# Patient Record
Sex: Female | Born: 1965
Health system: Southern US, Community
[De-identification: ages and names within clinical notes are randomized; demographics above are authoritative.]

## PROBLEM LIST (undated history)

## (undated) DIAGNOSIS — Z789 Other specified health status: Secondary | ICD-10-CM

---

## 2003-12-21 ENCOUNTER — Ambulatory Visit (HOSPITAL_COMMUNITY): Admission: RE | Admit: 2003-12-21 | Discharge: 2003-12-21 | Payer: Self-pay | Admitting: Family Medicine

## 2004-10-27 ENCOUNTER — Other Ambulatory Visit: Admission: RE | Admit: 2004-10-27 | Discharge: 2004-10-27 | Payer: Self-pay | Admitting: Dermatology

## 2006-01-10 ENCOUNTER — Emergency Department (HOSPITAL_COMMUNITY): Admission: EM | Admit: 2006-01-10 | Discharge: 2006-01-10 | Payer: Self-pay | Admitting: Emergency Medicine

## 2007-07-18 ENCOUNTER — Ambulatory Visit (HOSPITAL_COMMUNITY): Admission: RE | Admit: 2007-07-18 | Discharge: 2007-07-18 | Payer: Self-pay | Admitting: Family Medicine

## 2007-11-11 ENCOUNTER — Emergency Department (HOSPITAL_COMMUNITY): Admission: EM | Admit: 2007-11-11 | Discharge: 2007-11-12 | Payer: Self-pay | Admitting: Emergency Medicine

## 2007-11-18 ENCOUNTER — Ambulatory Visit (HOSPITAL_COMMUNITY): Admission: RE | Admit: 2007-11-18 | Discharge: 2007-11-18 | Payer: Self-pay | Admitting: Family Medicine

## 2007-12-09 ENCOUNTER — Ambulatory Visit (HOSPITAL_COMMUNITY): Admission: RE | Admit: 2007-12-09 | Discharge: 2007-12-09 | Payer: Self-pay | Admitting: Family Medicine

## 2010-09-04 ENCOUNTER — Encounter: Payer: Self-pay | Admitting: Family Medicine

## 2015-09-20 ENCOUNTER — Encounter: Payer: Self-pay | Admitting: Obstetrics and Gynecology

## 2015-12-08 ENCOUNTER — Other Ambulatory Visit (HOSPITAL_COMMUNITY): Payer: Self-pay | Admitting: Family Medicine

## 2015-12-08 DIAGNOSIS — R221 Localized swelling, mass and lump, neck: Principal | ICD-10-CM

## 2015-12-08 DIAGNOSIS — R220 Localized swelling, mass and lump, head: Secondary | ICD-10-CM

## 2015-12-13 ENCOUNTER — Ambulatory Visit (HOSPITAL_COMMUNITY)
Admission: RE | Admit: 2015-12-13 | Discharge: 2015-12-13 | Disposition: A | Discharge status: Home / Self Care | Payer: 59 | Source: Ambulatory Visit | Attending: Family Medicine | Admitting: Family Medicine

## 2015-12-13 ENCOUNTER — Other Ambulatory Visit (HOSPITAL_COMMUNITY): Payer: Self-pay | Admitting: Family Medicine

## 2015-12-13 DIAGNOSIS — R220 Localized swelling, mass and lump, head: Secondary | ICD-10-CM

## 2015-12-13 DIAGNOSIS — R221 Localized swelling, mass and lump, neck: Secondary | ICD-10-CM | POA: Diagnosis present

## 2016-10-17 DIAGNOSIS — L280 Lichen simplex chronicus: Secondary | ICD-10-CM | POA: Diagnosis not present

## 2016-10-17 DIAGNOSIS — Z85828 Personal history of other malignant neoplasm of skin: Secondary | ICD-10-CM | POA: Diagnosis not present

## 2016-10-17 DIAGNOSIS — L570 Actinic keratosis: Secondary | ICD-10-CM | POA: Diagnosis not present

## 2016-10-18 DIAGNOSIS — Z1389 Encounter for screening for other disorder: Secondary | ICD-10-CM | POA: Diagnosis not present

## 2016-10-18 DIAGNOSIS — M7662 Achilles tendinitis, left leg: Secondary | ICD-10-CM | POA: Diagnosis not present

## 2016-10-18 DIAGNOSIS — K5289 Other specified noninfective gastroenteritis and colitis: Secondary | ICD-10-CM | POA: Diagnosis not present

## 2016-11-02 ENCOUNTER — Encounter: Payer: Self-pay | Admitting: Obstetrics and Gynecology

## 2016-11-02 ENCOUNTER — Ambulatory Visit (INDEPENDENT_AMBULATORY_CARE_PROVIDER_SITE_OTHER): Payer: 59 | Admitting: Obstetrics and Gynecology

## 2016-11-02 VITALS — BP 114/62 | HR 88 | Resp 20 | Ht 64.0 in | Wt 173.0 lb

## 2016-11-02 DIAGNOSIS — Z1231 Encounter for screening mammogram for malignant neoplasm of breast: Secondary | ICD-10-CM | POA: Diagnosis not present

## 2016-11-02 DIAGNOSIS — E559 Vitamin D deficiency, unspecified: Secondary | ICD-10-CM

## 2016-11-02 DIAGNOSIS — Z01419 Encounter for gynecological examination (general) (routine) without abnormal findings: Secondary | ICD-10-CM

## 2016-11-02 LAB — CBC
HEMATOCRIT: 41.8 % (ref 35.0–45.0)
HEMOGLOBIN: 14 g/dL (ref 11.7–15.5)
MCH: 30.8 pg (ref 27.0–33.0)
MCHC: 33.5 g/dL (ref 32.0–36.0)
MCV: 92.1 fL (ref 80.0–100.0)
MPV: 10.2 fL (ref 7.5–12.5)
Platelets: 242 10*3/uL (ref 140–400)
RBC: 4.54 MIL/uL (ref 3.80–5.10)
RDW: 13.2 % (ref 11.0–15.0)
WBC: 8.3 10*3/uL (ref 3.8–10.8)

## 2016-11-02 LAB — COMPREHENSIVE METABOLIC PANEL
ALK PHOS: 56 U/L (ref 33–130)
ALT: 12 U/L (ref 6–29)
AST: 13 U/L (ref 10–35)
Albumin: 4.5 g/dL (ref 3.6–5.1)
BILIRUBIN TOTAL: 0.5 mg/dL (ref 0.2–1.2)
BUN: 12 mg/dL (ref 7–25)
CO2: 25 mmol/L (ref 20–31)
Calcium: 9.4 mg/dL (ref 8.6–10.4)
Chloride: 105 mmol/L (ref 98–110)
Creat: 0.78 mg/dL (ref 0.50–1.05)
Glucose, Bld: 86 mg/dL (ref 65–99)
POTASSIUM: 3.7 mmol/L (ref 3.5–5.3)
Sodium: 139 mmol/L (ref 135–146)
TOTAL PROTEIN: 7 g/dL (ref 6.1–8.1)

## 2016-11-02 LAB — LIPID PANEL
CHOL/HDL RATIO: 3.3 ratio (ref <5.0)
CHOLESTEROL: 186 mg/dL (ref <200)
HDL: 56 mg/dL (ref >50)
LDL Cholesterol: 103 mg/dL — ABNORMAL HIGH (ref <100)
Triglycerides: 134 mg/dL (ref <150)
VLDL: 27 mg/dL (ref <30)

## 2016-11-02 LAB — TSH: TSH: 1.45 mIU/L

## 2016-11-02 NOTE — Progress Notes (Signed)
51 y.o. G85P1001 Married Caucasian female here for annual exam.    Patient is a previous returning patient of mine.   No skipped menses.  Can be heavy or light.  Sleeping is "off." Uses melatonin.  No hot flashes but increased heat at times.  Having mood swings.  Feels down and tired.  Not wanting to isolate.  Able to feel joy. Not suicidal.  Mother in low has dementia.  She and her husband are the main caregivers but she is in an Alzheimer's unit.  Headaches from oral contraception so stopped this years ago.  Seeing dermatology.   GMW:NUUV Hilma Favors,  MD    Patient's last menstrual period was 10/12/2016 (exact date).     Period Cycle (Days): 30 Period Duration (Days): 4 Period Pattern: Regular Menstrual Flow:  (light to heavy(different each month)) Dysmenorrhea:  (mild to moderate)     Sexually active: Yes.   female The current method of family planning is none.    Exercising: Yes.    Weights and walking Smoker:  no  Health Maintenance: Pap:  2016 normal per patient History of abnormal Pap:  no MMG:  2016 normal per patient--had appt. At [facility] this am.  Results pending.  Colonoscopy: NEVER--PCP is helping her schedule BMD:   n/a  Result  n/a TDaP:  PCP Gardasil:   N/A   Screening Labs:  Urine today: not done   reports that she has never smoked. She has never used smokeless tobacco. She reports that she drinks alcohol. She reports that she does not use drugs.  History reviewed. No pertinent past medical history.  Past Surgical History:  Procedure Laterality Date  . CESAREAN SECTION  1990    Current Outpatient Prescriptions  Medication Sig Dispense Refill  . Multiple Vitamins-Minerals (MULTIVITAMIN ADULTS PO) Take 1 tablet by mouth daily.    . Omega-3 Fatty Acids (FISH OIL) 1000 MG CAPS Take 1 capsule by mouth daily.    Marland Kitchen OVER THE COUNTER MEDICATION Thermolean---Takes 2 tablets daily    . Turmeric 500 MG CAPS Take 1 tablet by mouth daily.    Marland Kitchen UNABLE TO FIND  Med Name: [place] daily     No current facility-administered medications for this visit.     Family History  Problem Relation Age of Onset  . Thyroid disease Mother   . Stroke Mother   . Hypertension Father   . Cancer Maternal Grandmother     Hx Thyroid Ca  . Cancer Maternal Grandfather     unknown origin  . Diabetes Paternal Grandmother   . Hyperlipidemia Paternal Grandfather   . Thyroid disease Maternal Aunt     ROS:  Pertinent items are noted in HPI.  Otherwise, a comprehensive ROS was negative.  Exam:   BP 114/62 (BP Location: Right Arm, Patient Position: Sitting, Cuff Size: Normal)   Pulse 88   Resp 20   Ht 5\' 4"  (1.626 m)   Wt 173 lb (78.5 kg)   LMP 10/12/2016 (Exact Date)   BMI 29.70 kg/m     General appearance: alert, cooperative and appears stated age Head: Normocephalic, without obvious abnormality, atraumatic Neck: no adenopathy, supple, symmetrical, trachea midline and thyroid normal to inspection and palpation Lungs: clear to auscultation bilaterally Breasts: normal appearance, no masses or tenderness, No nipple retraction or dimpling, No nipple discharge or bleeding, No axillary or supraclavicular adenopathy Heart: regular rate and rhythm Abdomen: soft, non-tender; no masses, no organomegaly Extremities: extremities normal, atraumatic, no cyanosis or edema  Skin: Skin color, texture, turgor normal. No rashes or lesions Lymph nodes: Cervical, supraclavicular, and axillary nodes normal. No abnormal inguinal nodes palpated Neurologic: Grossly normal  Pelvic: External genitalia:  no lesions              Urethra:  normal appearing urethra with no masses, tenderness or lesions              Bartholins and Skenes: normal                 Vagina: normal appearing vagina with normal color and discharge, no lesions              Cervix: no lesions              Pap taken: Yes.   Bimanual Exam:  Uterus:  normal size, contour, position, consistency,  mobility, non-tender              Adnexa: no mass, fullness, tenderness              Rectal exam: Yes.  .  Confirms.              Anus:  normal sphincter tone, no lesions  Chaperone was present for exam.  Assessment:   Well woman visit with normal exam. Fatigue.  Caregiver stress.   Plan: Mammogram screening discussed. Recommended self breast awareness. Pap and HR HPV as above. Guidelines for Calcium, Vitamin D, regular exercise program including cardiovascular and weight bearing exercise. Routine labs.  Information about Reeves Forth and Mirena IUD.  Risks and benefits of Mirena discussed in detail.  HO given. She will consider. Discussed taking more time for herself and her husband.  Follow up annually and prn.   After visit summary provided.

## 2016-11-02 NOTE — Patient Instructions (Signed)

## 2016-11-03 LAB — VITAMIN D 25 HYDROXY (VIT D DEFICIENCY, FRACTURES): VIT D 25 HYDROXY: 22 ng/mL — AB (ref 30–100)

## 2016-11-07 ENCOUNTER — Encounter: Payer: Self-pay | Admitting: Obstetrics and Gynecology

## 2016-11-07 LAB — IPS PAP TEST WITH HPV

## 2016-11-08 ENCOUNTER — Telehealth: Payer: Self-pay | Admitting: *Deleted

## 2016-11-08 NOTE — Telephone Encounter (Signed)
See telephone encounter dated 11/08/16.

## 2016-11-08 NOTE — Telephone Encounter (Signed)
I corrected this and sent the patient a MyChart message.  Cc: Dr. Quincy Simmonds

## 2016-11-08 NOTE — Telephone Encounter (Signed)
Thank you, will close encounter

## 2016-11-08 NOTE — Telephone Encounter (Signed)
Jenny Howard, is this something you can assist patient with?    From Jenny Howard To Jenny Cobbs, MD Sent 11/07/2016 6:45 PM  The name on my account is wrong. It needs to be changed to Jenny Howard  This must be changed due to another person with same name. This gets into identity issues. Have been through other mishaps because of name being misspelled. Can someone please change this.                            Thank you  Cc: Dr. Quincy Simmonds

## 2016-11-09 ENCOUNTER — Encounter: Payer: Self-pay | Admitting: Obstetrics and Gynecology

## 2016-11-10 NOTE — Addendum Note (Signed)
Addended by: Yisroel Ramming, Dietrich Pates E on: 11/10/2016 06:15 PM   Modules accepted: Orders

## 2016-11-13 ENCOUNTER — Telehealth: Payer: Self-pay

## 2016-11-13 NOTE — Telephone Encounter (Signed)
[phone]  PATIENT RECEIVED LETTER TO SCHEDULE TCS, NEEDS TO BE CALLED TODAY OR TOMORROW SO SHE CAN HAVE HER HUSBANDS SCHEDULE WITH HER

## 2016-11-15 NOTE — Telephone Encounter (Signed)
LMOM to call.

## 2016-11-16 ENCOUNTER — Telehealth: Payer: Self-pay

## 2016-11-16 NOTE — Telephone Encounter (Signed)
See separate triage.  

## 2016-11-21 ENCOUNTER — Other Ambulatory Visit: Payer: Self-pay

## 2016-11-21 DIAGNOSIS — Z1211 Encounter for screening for malignant neoplasm of colon: Secondary | ICD-10-CM

## 2016-11-21 NOTE — Telephone Encounter (Signed)
Gastroenterology Pre-Procedure Review  Request Date: 11/16/2016 Requesting Physician: Dr. Hilma Favors  PATIENT REVIEW QUESTIONS: The patient responded to the following health history questions as indicated:    1. Diabetes Melitis: no 2. Joint replacements in the past 12 months: no 3. Major health problems in the past 3 months: no 4. Has an artificial valve or MVP: no 5. Has a defibrillator: no 6. Has been advised in past to take antibiotics in advance of a procedure like teeth cleaning: no 7. Family history of colon cancer: no  8. Alcohol Use: YES/ once a month wine or beer 9. History of sleep apnea: no  10. History of coronary artery or other vascular stents placed within the last 12 months: no    MEDICATIONS & ALLERGIES:    Patient reports the following regarding taking any blood thinners:   Plavix? no Aspirin? no Coumadin? no Brilinta? no Xarelto? no Eliquis? no Pradaxa? no Savaysa? no Effient? no  Patient confirms/reports the following medications:  Current Outpatient Prescriptions  Medication Sig Dispense Refill  . Multiple Vitamins-Minerals (MULTIVITAMIN ADULTS PO) Take 1 tablet by mouth daily.    . Omega-3 Fatty Acids (FISH OIL) 1000 MG CAPS Take 1 capsule by mouth daily.    . Turmeric 500 MG CAPS Take 1 tablet by mouth daily.    Marland Kitchen UNABLE TO FIND Med Name: [place] daily    . OVER THE COUNTER MEDICATION Thermolean---Takes 2 tablets daily     No current facility-administered medications for this visit.     Patient confirms/reports the following allergies:  Allergies  Allergen Reactions  . Prednisone Swelling    Difficulty swallowing    No orders of the defined types were placed in this encounter.   AUTHORIZATION INFORMATION Primary Insurance:   ID #:  Group #:  Pre-Cert / Auth required: Pre-Cert / Auth #:   Secondary Insurance:   ID #:  Group #:  Pre-Cert / Auth required: Pre-Cert / Auth #:   SCHEDULE INFORMATION: Procedure has been scheduled  as follows:  Date: 12/04/2016            Time:11:15 AM   Location: [facility] Short Stay  This Gastroenterology Pre-Precedure Review Form is being routed to the following provider(s): Barney Drain, MD

## 2016-11-21 NOTE — Telephone Encounter (Signed)
CLENPIQ-DRINK WATER TO KEEP URINE LIGHT YELLOW. FULL LIQUIDS WITH BREAKFAST.  Full Liquid Diet A high-calorie, high-protein supplement should be used to meet your nutritional requirements when the full liquid diet is continued for more than 2 or 3 days. If this diet is to be used for an extended period of time (more than 7 days), a multivitamin should be considered.  Breads and Starches  Allowed: None are allowed   Avoid: Any others.    Potatoes/Pasta/Rice  Allowed: ANY ITEM AS A SOUP OR SMALL PLATE OF MASHED POTATOES OR SCRAMBLED EGGS. (DO NOT EAT MORE THAN ONE SERVING ON THE DAY BEFORE COLONOSCOPY).      Vegetables  Allowed: Strained tomato or vegetable juice. Vegetables pureed in soup.   Avoid: Any others.    Fruit  Allowed: Any strained fruit juices and fruit drinks. Include 1 serving of citrus or vitamin C-enriched fruit juice daily.   Avoid: Any others.  Meat and Meat Substitutes  Allowed: Egg  Avoid: Any meat, fish, or fowl. All cheese.  Milk  Allowed: SOY Milk beverages, including milk shakes and instant breakfast mixes. Smooth yogurt.   Avoid: Any others. Avoid dairy products if not tolerated.    Soups and Combination Foods  Allowed: Broth, strained cream soups. Strained, broth-based soups.   Avoid: Any others.    Desserts and Sweets  Allowed: flavored gelatin, tapioca, ice cream, sherbet, smooth pudding, junket, fruit ices, frozen ice pops, pudding pops, frozen fudge pops, chocolate syrup. Sugar, honey, jelly, syrup.   Avoid: Any others.  Fats and Oils  Allowed: Margarine, butter, cream, sour cream, oils.   Avoid: Any others.  Beverages  Allowed: All.   Avoid: None.  Condiments  Allowed: Iodized salt, pepper, spices, flavorings. Cocoa powder.   Avoid: Any others.    SAMPLE MEAL PLAN Breakfast   cup orange juice.   1 OR 2 EGGS  1 cup milk.   1 cup beverage (coffee or tea).   Cream or sugar, if desired.    Midmorning  Snack  2 SCRAMBLED OR HARD BOILED EGG   Lunch  1 cup cream soup.    cup fruit juice.   1 cup milk.    cup custard.   1 cup beverage (coffee or tea).   Cream or sugar, if desired.    Midafternoon Snack  1 cup milk shake.  Dinner  1 cup cream soup.    cup fruit juice.   1 cup MILK    cup pudding.   1 cup beverage (coffee or tea).   Cream or sugar, if desired.  Evening Snack  1 cup supplement.  To increase calories, add sugar, cream, butter, or margarine if possible. Nutritional supplements will also increase the total calories.

## 2016-11-28 MED ORDER — SOD PICOSULFATE-MAG OX-CIT ACD 10-3.5-12 MG-GM -GM/160ML PO SOLN: 1.0000 | Freq: Once | ORAL | 0 refills | Status: AC

## 2016-11-28 NOTE — Telephone Encounter (Signed)
PA# [id number]

## 2016-11-28 NOTE — Telephone Encounter (Signed)
Rx sent to the pharmacy and instructions faxed to the pharmacy. I called pt and LMOM that the prescription and instructions have been sent to the pharmacy and to call if she has questions.

## 2016-11-29 NOTE — Telephone Encounter (Signed)
Tried to call pt. LMOVM and informed pt to arrive at 9:45am 12/04/16 for TCS. Also informed to start drinking prep that morning at 5:45am and complete by 7:45am. Will inform endo scheduler.

## 2016-11-30 ENCOUNTER — Telehealth: Payer: Self-pay | Admitting: Gastroenterology

## 2016-11-30 MED ORDER — NA SULFATE-K SULFATE-MG SULF 17.5-3.13-1.6 GM/177ML PO SOLN: 1.0000 | ORAL | 0 refills | Status: DC

## 2016-11-30 NOTE — Telephone Encounter (Signed)
Pt is scheduled tcs on 4/23 with [facility] and her prep (Clentiq) isn't covered by her insurance and the pharmacy doesn't have it in stock. She said that her husband had his tcs last month and he had Suprep and she knows their insurance would cover that. She would like that instead and to be called into [facility] in [place], [place]

## 2016-11-30 NOTE — Telephone Encounter (Signed)
LMOM that I have sent in the [place] and faxed the instructions to the pharmacy. Please check and make sure she gets it today, we close at noon tomorrow.

## 2016-12-04 ENCOUNTER — Encounter (HOSPITAL_COMMUNITY): Payer: Self-pay | Admitting: *Deleted

## 2016-12-04 ENCOUNTER — Ambulatory Visit (HOSPITAL_COMMUNITY)
Admission: RE | Admit: 2016-12-04 | Discharge: 2016-12-04 | Disposition: A | Discharge status: Home / Self Care | Payer: 59 | Source: Ambulatory Visit | Attending: Gastroenterology | Admitting: Gastroenterology

## 2016-12-04 ENCOUNTER — Encounter (HOSPITAL_COMMUNITY)
Admission: RE | Disposition: A | Discharge status: Home / Self Care | Payer: Self-pay | Source: Ambulatory Visit | Attending: Gastroenterology

## 2016-12-04 DIAGNOSIS — K644 Residual hemorrhoidal skin tags: Secondary | ICD-10-CM | POA: Insufficient documentation

## 2016-12-04 DIAGNOSIS — Z1211 Encounter for screening for malignant neoplasm of colon: Secondary | ICD-10-CM | POA: Diagnosis not present

## 2016-12-04 DIAGNOSIS — Z79899 Other long term (current) drug therapy: Secondary | ICD-10-CM | POA: Insufficient documentation

## 2016-12-04 DIAGNOSIS — K648 Other hemorrhoids: Secondary | ICD-10-CM | POA: Diagnosis not present

## 2016-12-04 DIAGNOSIS — Z1212 Encounter for screening for malignant neoplasm of rectum: Secondary | ICD-10-CM

## 2016-12-04 DIAGNOSIS — K562 Volvulus: Secondary | ICD-10-CM | POA: Diagnosis not present

## 2016-12-04 HISTORY — DX: Other specified health status: Z78.9

## 2016-12-04 HISTORY — PX: COLONOSCOPY: SHX5424

## 2016-12-04 SURGERY — COLONOSCOPY
Anesthesia: Moderate Sedation

## 2016-12-04 MED ORDER — MIDAZOLAM HCL 5 MG/5ML IJ SOLN
INTRAMUSCULAR | Status: AC
  Filled 2016-12-04: qty 10

## 2016-12-04 MED ORDER — SODIUM CHLORIDE 0.9 % IV SOLN
INTRAVENOUS | Status: DC
  Administered 2016-12-04: 10:00:00 via INTRAVENOUS

## 2016-12-04 MED ORDER — MIDAZOLAM HCL 5 MG/5ML IJ SOLN
INTRAMUSCULAR | Status: DC | PRN
  Administered 2016-12-04 (×2): 2 mg via INTRAVENOUS

## 2016-12-04 MED ORDER — MEPERIDINE HCL 100 MG/ML IJ SOLN
INTRAMUSCULAR | Status: DC | PRN
  Administered 2016-12-04 (×2): 25 mg via INTRAVENOUS

## 2016-12-04 MED ORDER — MEPERIDINE HCL 100 MG/ML IJ SOLN
INTRAMUSCULAR | Status: AC
  Filled 2016-12-04: qty 2

## 2016-12-04 MED ORDER — STERILE WATER FOR IRRIGATION IR SOLN
Status: DC | PRN
  Administered 2016-12-04: 11:00:00

## 2016-12-04 NOTE — Op Note (Signed)
[facility] Patient Name: Jenny Howard Procedure Date: 12/04/2016 10:36 AM MRN: [id number] Date of Birth: [birth year] Attending MD: Barney Drain , MD CSN: [id number] Age: 51 Admit Type: Outpatient Procedure:                Colonoscopy, SCREENING Indications:              Screening for colorectal malignant neoplasm Providers:                Barney Drain, MD, Charlyne Petrin RN, RN, Zoila Shutter, Technologist Referring MD:             Halford Chessman MD, MD Medicines:                Meperidine 75 mg IV, Midazolam 2 mg IV Complications:            No immediate complications. Estimated Blood Loss:     Estimated blood loss: none. Procedure:                Pre-Anesthesia Assessment:                           - Prior to the procedure, a History and Physical                            was performed, and patient medications and                            allergies were reviewed. The patient's tolerance of                            previous anesthesia was also reviewed. The risks                            and benefits of the procedure and the sedation                            options and risks were discussed with the patient.                            All questions were answered, and informed consent                            was obtained. Prior Anticoagulants: The patient has                            taken ibuprofen. ASA Grade Assessment: I - A                            normal, healthy patient. After reviewing the risks                            and benefits, the patient was deemed in  satisfactory condition to undergo the procedure.                            After obtaining informed consent, the colonoscope                            was passed under direct vision. Throughout the                            procedure, the patient's blood pressure, pulse, and                            oxygen saturations were monitored  continuously. The                            EC-[id number] ([id number]) scope was introduced through                            the anus and advanced to the the cecum, identified                            by appendiceal orifice and ileocecal valve. The                            colonoscopy was somewhat difficult due to a                            tortuous colon. Successful completion of the                            procedure was aided by COLOWRAP. The patient                            tolerated the procedure well. The quality of the                            bowel preparation was excellent. The ileocecal                            valve, appendiceal orifice, and rectum were                            photographed. Scope In: 11:09:02 AM Scope Out: 11:20:03 AM Scope Withdrawal Time: 0 hours 8 minutes 56 seconds  Total Procedure Duration: 0 hours 11 minutes 1 second  Findings:      The sigmoid colon and descending colon were moderately redundant.      The exam was otherwise without abnormality.      External and internal hemorrhoids were found during retroflexion. The       hemorrhoids were small. Impression:               - Redundant LEFT colon.                           - External and internal  hemorrhoids. Moderate Sedation:      Moderate (conscious) sedation was administered by the endoscopy nurse       and supervised by the endoscopist. The following parameters were       monitored: oxygen saturation, heart rate, blood pressure, and response       to care. Total physician intraservice time was 20 minutes. Recommendation:           - Repeat colonoscopy in 10 years for surveillance.                           - High fiber diet.                           - Continue present medications.                           - Patient has a contact number available for                            emergencies. The signs and symptoms of potential                            delayed complications were  discussed with the                            patient. Return to normal activities tomorrow.                            Written discharge instructions were provided to the                            patient. Procedure Code(s):        --- Professional ---                           [phone], Colonoscopy, flexible; diagnostic, including                            collection of specimen(s) by brushing or washing,                            when performed (separate procedure)                           99152, Moderate sedation services provided by the                            same physician or other qualified health care                            professional performing the diagnostic or                            therapeutic service that the sedation supports,  requiring the presence of an independent trained                            observer to assist in the monitoring of the                            patient's level of consciousness and physiological                            status; initial 15 minutes of intraservice time,                            patient age 17 years or older Diagnosis Code(s):        --- Professional ---                           Z12.11, Encounter for screening for malignant                            neoplasm of colon                           K64.8, Other hemorrhoids                           Q43.8, Other specified congenital malformations of                            intestine CPT copyright 2016 [employer]. All rights reserved. The codes documented in this report are preliminary and upon coder review may  be revised to meet current compliance requirements. Barney Drain, MD Barney Drain, MD 12/04/2016 11:27:04 AM This report has been signed electronically. Number of Addenda: 0

## 2016-12-04 NOTE — Discharge Instructions (Signed)
YOU DID NOT HAVE ANY POLYPS.  You have SMALL internal AND EXTERNAL hemorrhoids.    DRINK WATER TO KEEP YOUR URINE LIGHT YELLOW.  FOLLOW A HIGH FIBER DIET. AVOID ITEMS THAT CAUSE BLOATING & GAS. SEE INFO BELOW.  USE PREPARATION H FOUR TIMES  A DAY IF NEEDED TO RELIEVE RECTAL PAIN/PRESSURE/BLEEDING.  Next colonoscopy in 10 years.   Colonoscopy Care After Read the instructions outlined below and refer to this sheet in the next week. These discharge instructions provide you with general information on caring for yourself after you leave the hospital. While your treatment has been planned according to the most current medical practices available, unavoidable complications occasionally occur. If you have any problems or questions after discharge, call DR. Marline Morace, [phone].  ACTIVITY  You may resume your regular activity, but move at a slower pace for the next 24 hours.   Take frequent rest periods for the next 24 hours.   Walking will help get rid of the air and reduce the bloated feeling in your belly (abdomen).   No driving for 24 hours (because of the medicine (anesthesia) used during the test).   You may shower.   Do not sign any important legal documents or operate any machinery for 24 hours (because of the anesthesia used during the test).    NUTRITION  Drink plenty of fluids.   You may resume your normal diet as instructed by your doctor.   Begin with a light meal and progress to your normal diet. Heavy or fried foods are harder to digest and may make you feel sick to your stomach (nauseated).   Avoid alcoholic beverages for 24 hours or as instructed.    MEDICATIONS  You may resume your normal medications.   WHAT YOU CAN EXPECT TODAY  Some feelings of bloating in the abdomen.   Passage of more gas than usual.   Spotting of blood in your stool or on the toilet paper  .  IF YOU HAD POLYPS REMOVED DURING THE COLONOSCOPY:  Eat a soft diet IF YOU HAVE  NAUSEA, BLOATING, ABDOMINAL PAIN, OR VOMITING.    FINDING OUT THE RESULTS OF YOUR TEST Not all test results are available during your visit. DR. Oneida Alar WILL CALL YOU WITHIN 14 DAYS OF YOUR PROCEDUE WITH YOUR RESULTS. Do not assume everything is normal if you have not heard from DR. Marguerette Sheller, CALL HER OFFICE AT [phone].  SEEK IMMEDIATE MEDICAL ATTENTION AND CALL THE OFFICE: [phone] IF:  You have more than a spotting of blood in your stool.   Your belly is swollen (abdominal distention).   You are nauseated or vomiting.   You have a temperature over 101F.   You have abdominal pain or discomfort that is severe or gets worse throughout the day.   High-Fiber Diet A high-fiber diet changes your normal diet to include more whole grains, legumes, fruits, and vegetables. Changes in the diet involve replacing refined carbohydrates with unrefined foods. The calorie level of the diet is essentially unchanged. The Dietary Reference Intake (recommended amount) for adult males is 38 grams per day. For adult females, it is 25 grams per day. Pregnant and lactating women should consume 28 grams of fiber per day. Fiber is the intact part of a plant that is not broken down during digestion. Functional fiber is fiber that has been isolated from the plant to provide a beneficial effect in the body. PURPOSE  Increase stool bulk.   Ease and regulate bowel movements.   Lower  cholesterol.   REDUCE RISK OF COLON CANCER  INDICATIONS THAT YOU NEED MORE FIBER  Constipation and hemorrhoids.   Uncomplicated diverticulosis (intestine condition) and irritable bowel syndrome.   Weight management.   As a protective measure against hardening of the arteries (atherosclerosis), diabetes, and cancer.   GUIDELINES FOR INCREASING FIBER IN THE DIET  Start adding fiber to the diet slowly. A gradual increase of about 5 more grams (2 slices of whole-wheat bread, 2 servings of most fruits or vegetables, or 1  bowl of high-fiber cereal) per day is best. Too rapid an increase in fiber may result in constipation, flatulence, and bloating.   Drink enough water and fluids to keep your urine clear or pale yellow. Water, juice, or caffeine-free drinks are recommended. Not drinking enough fluid may cause constipation.   Eat a variety of high-fiber foods rather than one type of fiber.   Try to increase your intake of fiber through using high-fiber foods rather than fiber pills or supplements that contain small amounts of fiber.   The goal is to change the types of food eaten. Do not supplement your present diet with high-fiber foods, but replace foods in your present diet.   INCLUDE A VARIETY OF FIBER SOURCES  Replace refined and processed grains with whole grains, canned fruits with fresh fruits, and incorporate other fiber sources. White rice, white breads, and most bakery goods contain little or no fiber.   Brown whole-grain rice, buckwheat oats, and many fruits and vegetables are all good sources of fiber. These include: broccoli, Brussels sprouts, cabbage, cauliflower, beets, sweet potatoes, white potatoes (skin on), carrots, tomatoes, eggplant, squash, berries, fresh fruits, and dried fruits.   Cereals appear to be the richest source of fiber. Cereal fiber is found in whole grains and bran. Bran is the fiber-rich outer coat of cereal grain, which is largely removed in refining. In whole-grain cereals, the bran remains. In breakfast cereals, the largest amount of fiber is found in those with "bran" in their names. The fiber content is sometimes indicated on the label.   You may need to include additional fruits and vegetables each day.   In baking, for 1 cup white flour, you may use the following substitutions:   1 cup whole-wheat flour minus 2 tablespoons.   1/2 cup white flour plus 1/2 cup whole-wheat flour.   Polyps, Colon  A polyp is extra tissue that grows inside your body. Colon polyps grow  in the large intestine. The large intestine, also called the colon, is part of your digestive system. It is a long, hollow tube at the end of your digestive tract where your body makes and stores stool. Most polyps are not dangerous. They are benign. This means they are not cancerous. But over time, some types of polyps can turn into cancer. Polyps that are smaller than a pea are usually not harmful. But larger polyps could someday become or may already be cancerous. To be safe, doctors remove all polyps and test them.   PREVENTION There is not one sure way to prevent polyps. You might be able to lower your risk of getting them if you:  Eat more fruits and vegetables and less fatty food.   Do not smoke.   Avoid alcohol.   Exercise every day.   Lose weight if you are overweight.   Eating more calcium and folate can also lower your risk of getting polyps. Some foods that are rich in calcium are milk, cheese, and broccoli. Some  foods that are rich in folate are chickpeas, kidney beans, and spinach.   Hemorrhoids Hemorrhoids are dilated (enlarged) veins around the rectum. Sometimes clots will form in the veins. This makes them swollen and painful. These are called thrombosed hemorrhoids. Causes of hemorrhoids include:  Constipation.   Straining to have a bowel movement.   HEAVY LIFTING  HOME CARE INSTRUCTIONS  Eat a well balanced diet and drink 6 to 8 glasses of water every day to avoid constipation. You may also use a bulk laxative.   Avoid straining to have bowel movements.   Keep anal area dry and clean.   Do not use a donut shaped pillow or sit on the toilet for long periods. This increases blood pooling and pain.   Move your bowels when your body has the urge; this will require less straining and will decrease pain and pressure.

## 2016-12-04 NOTE — H&P (Signed)
  Primary Care Physician:  Purvis Kilts, MD Primary Gastroenterologist:  Dr. Oneida Alar  Pre-Procedure History & Physical: HPI:  Jenny Howard is a 51 y.o. female here for [place].  Past Medical History:  Diagnosis Date  . Medical history non-contributory     Past Surgical History:  Procedure Laterality Date  . [place]    Prior to Admission medications   Medication Sig Start Date End Date Taking? Authorizing Provider  cholecalciferol (VITAMIN D) 1000 units tablet Take 1,000 Units by mouth daily.   Yes Historical Provider, MD  Multiple Vitamins-Minerals (MULTIVITAMIN ADULTS PO) Take 1 tablet by mouth daily.   Yes Historical Provider, MD  Omega-3 Fatty Acids (FISH OIL) 1000 MG CAPS Take 1 capsule by mouth daily.   Yes Historical Provider, MD  phentermine (ADIPEX-P) 37.5 MG tablet Take 37.5 mg by mouth daily before breakfast.   Yes Historical Provider, MD  Turmeric 500 MG CAPS Take 1 tablet by mouth 3 (three) times a week.    Yes Historical Provider, MD    Allergies as of 11/21/2016 - Review Complete 11/16/2016  Allergen Reaction Noted  . Prednisone Swelling 11/02/2016    Family History  Problem Relation Age of Onset  . Thyroid disease Mother   . Stroke Mother   . Hypertension Father   . Cancer Maternal Grandmother     Hx Thyroid Ca  . Cancer Maternal Grandfather     unknown origin  . Diabetes Paternal Grandmother   . Hyperlipidemia Paternal Grandfather   . Thyroid disease Maternal Aunt   . Colon cancer Cousin     Social History   Social History  . Marital status: Married    Spouse name: N/A  . Number of children: N/A  . Years of education: N/A   Occupational History  . Not on file.   Social History Main Topics  . Smoking status: Never Smoker  . Smokeless tobacco: Never Used  . Alcohol use Yes     Comment: 1 drink per month  . Drug use: No  . Sexual activity: Yes    Partners: Male    Birth control/ protection: None    Other Topics Concern  . Not on file   Social History Narrative  . No narrative on file    Review of Systems: See HPI, otherwise negative ROS   Physical Exam: BP 100/60   Pulse 80   Temp 99 F (37.2 C) (Oral)   Resp 13   Ht 5\' 4"  (1.626 m)   Wt 165 lb (74.8 kg)   LMP 12/02/2016 (Exact Date)   SpO2 99%   BMI 28.32 kg/m  General:   Alert,  pleasant and cooperative in NAD Head:  Normocephalic and atraumatic. Neck:  Supple; Lungs:  Clear throughout to auscultation.    Heart:  Regular rate and rhythm. Abdomen:  Soft, nontender and nondistended. Normal bowel sounds, without guarding, and without rebound.   Neurologic:  Alert and  oriented x4;  grossly normal neurologically.  Impression/Plan:     SCREENING  Plan:  1. TCS TODAY. DISCUSSED PROCEDURE, BENEFITS, & RISKS: < 1% chance of medication reaction, bleeding, perforation, or rupture of spleen/liver.

## 2016-12-07 ENCOUNTER — Encounter (HOSPITAL_COMMUNITY): Payer: Self-pay | Admitting: Gastroenterology

## 2017-01-02 DIAGNOSIS — Z0000 Encounter for general adult medical examination without abnormal findings: Secondary | ICD-10-CM | POA: Diagnosis not present

## 2017-01-02 DIAGNOSIS — E782 Mixed hyperlipidemia: Secondary | ICD-10-CM | POA: Diagnosis not present

## 2017-01-30 DIAGNOSIS — L259 Unspecified contact dermatitis, unspecified cause: Secondary | ICD-10-CM | POA: Diagnosis not present

## 2017-01-30 DIAGNOSIS — L255 Unspecified contact dermatitis due to plants, except food: Secondary | ICD-10-CM | POA: Diagnosis not present

## 2017-02-08 DIAGNOSIS — B880 Other acariasis: Secondary | ICD-10-CM | POA: Diagnosis not present

## 2017-03-07 ENCOUNTER — Telehealth: Payer: Self-pay | Admitting: Obstetrics and Gynecology

## 2017-03-07 NOTE — Telephone Encounter (Signed)
Spoke with patient. Reports irregular menses with lightheadedness, pelvic "soreness", HA with nausea with menses, flushed face, denies fever. Had cycle on 7/8, lasted 3-4 days, restarted on 7/24. Has super tampon in place since 1:30pm today, has not had to change yet. Reports cycles have been irregular in the past, anywhere from 2-4 weeks apart. No current contraceptive use. Recommended OV for further evaluation, patient scheduled for OV on 03/07/17 at 11am with Dr. Quincy Simmonds. Advised patient would review with Dr. Quincy Simmonds and return call with any additional recommendations, patient is agreeable.    Dr. Quincy Simmonds -any additional recommendations?

## 2017-03-07 NOTE — Telephone Encounter (Signed)
Left message to call Ayelet Gruenewald at [phone].  

## 2017-03-07 NOTE — Telephone Encounter (Signed)
Spoke with patient, advised patient will keep appointment as scheduled. Patient verbalizes understanding and is agreeable.    Will close encounter.

## 2017-03-07 NOTE — Telephone Encounter (Signed)
Keep appointment as scheduled.  Will assess tomorrow for further needs.

## 2017-03-07 NOTE — Telephone Encounter (Signed)
Patient called requesting to speak with the nurse about "having menstrual cycles every two weeks for the last month."  Last seen: 11/02/16

## 2017-03-08 ENCOUNTER — Encounter: Payer: Self-pay | Admitting: Obstetrics and Gynecology

## 2017-03-08 ENCOUNTER — Ambulatory Visit (INDEPENDENT_AMBULATORY_CARE_PROVIDER_SITE_OTHER): Payer: 59 | Admitting: Obstetrics and Gynecology

## 2017-03-08 VITALS — BP 112/72 | HR 80 | Resp 16 | Wt 160.0 lb

## 2017-03-08 DIAGNOSIS — R42 Dizziness and giddiness: Secondary | ICD-10-CM

## 2017-03-08 DIAGNOSIS — N926 Irregular menstruation, unspecified: Secondary | ICD-10-CM | POA: Diagnosis not present

## 2017-03-08 DIAGNOSIS — R5383 Other fatigue: Secondary | ICD-10-CM | POA: Diagnosis not present

## 2017-03-08 LAB — POCT URINE PREGNANCY: PREG TEST UR: NEGATIVE

## 2017-03-08 MED ORDER — MEDROXYPROGESTERONE ACETATE 10 MG PO TABS: 10.0000 mg | ORAL_TABLET | Freq: Every day | ORAL | 0 refills | Status: DC

## 2017-03-08 NOTE — Patient Instructions (Signed)
Perimenopause Perimenopause is the time when your body begins to move into the menopause (no menstrual period for 12 straight months). It is a natural process. Perimenopause can begin 2-8 years before the menopause and usually lasts for 1 year after the menopause. During this time, your ovaries may or may not produce an egg. The ovaries vary in their production of estrogen and progesterone hormones each month. This can cause irregular menstrual periods, difficulty getting pregnant, vaginal bleeding between periods, and uncomfortable symptoms. What are the causes?  Irregular production of the ovarian hormones, estrogen and progesterone, and not ovulating every month. Other causes include:  Tumor of the pituitary gland in the brain.  Medical disease that affects the ovaries.  Radiation treatment.  Chemotherapy.  Unknown causes.  Heavy smoking and excessive alcohol intake can bring on perimenopause sooner. What are the signs or symptoms?  Hot flashes.  Night sweats.  Irregular menstrual periods.  Decreased sex drive.  Vaginal dryness.  Headaches.  Mood swings.  Depression.  Memory problems.  Irritability.  Tiredness.  Weight gain.  Trouble getting pregnant.  The beginning of losing bone cells (osteoporosis).  The beginning of hardening of the arteries (atherosclerosis). How is this diagnosed? Your health care provider will make a diagnosis by analyzing your age, menstrual history, and symptoms. He or she will do a physical exam and note any changes in your body, especially your female organs. Female hormone tests may or may not be helpful depending on the amount of female hormones you produce and when you produce them. However, other hormone tests may be helpful to rule out other problems. How is this treated? In some cases, no treatment is needed. The decision on whether treatment is necessary during the perimenopause should be made by you and your health care  provider based on how the symptoms are affecting you and your lifestyle. Various treatments are available, such as:  Treating individual symptoms with a specific medicine for that symptom.  Herbal medicines that can help specific symptoms.  Counseling.  Group therapy. Follow these instructions at home:  Keep track of your menstrual periods (when they occur, how heavy they are, how long between periods, and how long they last) as well as your symptoms and when they started.  Only take over-the-counter or prescription medicines as directed by your health care provider.  Sleep and rest.  Exercise.  Eat a diet that contains calcium (good for your bones) and soy (acts like the estrogen hormone).  Do not smoke.  Avoid alcoholic beverages.  Take vitamin supplements as recommended by your health care provider. Taking vitamin E may help in certain cases.  Take calcium and vitamin D supplements to help prevent bone loss.  Group therapy is sometimes helpful.  Acupuncture may help in some cases. Contact a health care provider if:  You have questions about any symptoms you are having.  You need a referral to a specialist (gynecologist, psychiatrist, or psychologist). Get help right away if:  You have vaginal bleeding.  Your period lasts longer than 8 days.  Your periods are recurring sooner than 21 days.  You have bleeding after intercourse.  You have severe depression.  You have pain when you urinate.  You have severe headaches.  You have vision problems. This information is not intended to replace advice given to you by your health care provider. Make sure you discuss any questions you have with your health care provider. Document Released: 09/07/2004 Document Revised: 01/06/2016 Document Reviewed: 02/27/2013 Elsevier Interactive   Patient Education  2017 [employer]  

## 2017-03-08 NOTE — Progress Notes (Signed)
GYNECOLOGY  VISIT   HPI: 51 y.o.   Married  Caucasian  female   G1P1001 with Patient's last menstrual period was 03/07/2017.   here for  Pelvic pain, irregular menses  UPT: negative  Menses are occurring every other week starting last month. Bleeds for 2 -3 days. Not just spotting.  At worst is changing very 45 minutes to one hour.   Prior to this was having cycles every 3 to 3.5 weeks.   Can have cramping and having to leave work.  Increased headache.  Lower abdominal pain.   States she is having headaches, back aches, and cravings with her cycles.   Increased thirst.   Can feel hot.  Nausea.  Tired. Has low vit D.  Being tx by Dr. Hilma Favors.    GYNECOLOGIC HISTORY: Patient's last menstrual period was 03/07/2017. Contraception:  Condoms.  Menopausal hormone therapy:  none Last mammogram:  11/02/16 BIRADS 1 negative/density c Last pap smear:   11/02/16 Pap and HR HPV negative        OB History    Gravida Para Term Preterm AB Living   1 1 1     1    SAB TAB Ectopic Multiple Live Births                     Patient Active Problem List   Diagnosis Date Noted  . Special screening for malignant neoplasms, colon     Past Medical History:  Diagnosis Date  . Medical history non-contributory     Past Surgical History:  Procedure Laterality Date  . CESAREAN SECTION  1990  . COLONOSCOPY N/A 12/04/2016   Procedure: COLONOSCOPY;  Surgeon: Danie Binder, MD;  Location: [facility];  Service: Endoscopy;  Laterality: N/A;  11:15 AM    Current Outpatient Prescriptions  Medication Sig Dispense Refill  . cholecalciferol (VITAMIN D) 1000 units tablet Take 1,000 Units by mouth daily.    . Multiple Vitamins-Minerals (MULTIVITAMIN ADULTS PO) Take 1 tablet by mouth daily.    . Omega-3 Fatty Acids (FISH OIL) 1000 MG CAPS Take 1 capsule by mouth daily.    . phentermine (ADIPEX-P) 37.5 MG tablet Take 37.5 mg by mouth daily before breakfast.    . Turmeric 500 MG CAPS Take 1 tablet  by mouth 3 (three) times a week.      No current facility-administered medications for this visit.      ALLERGIES: Prednisone  Family History  Problem Relation Age of Onset  . Thyroid disease Mother   . Stroke Mother   . Hypertension Father   . Cancer Maternal Grandmother        Hx Thyroid Ca  . Cancer Maternal Grandfather        unknown origin  . Diabetes Paternal Grandmother   . Hyperlipidemia Paternal Grandfather   . Thyroid disease Maternal Aunt   . Colon cancer Cousin     Social History   Social History  . Marital status: Married    Spouse name: N/A  . Number of children: N/A  . Years of education: N/A   Occupational History  . Not on file.   Social History Main Topics  . Smoking status: Never Smoker  . Smokeless tobacco: Never Used  . Alcohol use Yes     Comment: 1 drink per month  . Drug use: No  . Sexual activity: Yes    Partners: Male    Birth control/ protection: None   Other Topics Concern  .  Not on file   Social History Narrative  . No narrative on file    ROS:  Pertinent items are noted in HPI.  PHYSICAL EXAMINATION:    BP 112/72 (BP Location: Right Arm, Patient Position: Sitting, Cuff Size: Normal)   Pulse 80   Resp 16   Wt 160 lb (72.6 kg)   LMP 03/07/2017   BMI 27.46 kg/m     General appearance: alert, cooperative and appears stated age   Pelvic: External genitalia:  no lesions              Urethra:  normal appearing urethra with no masses, tenderness or lesions              Bartholins and Skenes: normal                 Vagina: normal appearing vagina with normal color and discharge, no lesions              Cervix: no lesions                Bimanual Exam:  Uterus:  normal size, contour, position, consistency, mobility, non-tender              Adnexa: no mass, fullness, tenderness            Chaperone was present for exam.  ASSESSMENT  Fatigue.  Dizziness. Irregular menses. Likely perimenopausal female.   Dysmenorrhea.  PLAN  CMP, CBC, TSH.  Discussed anovulatory bleeding.  Provera 10 mg po x 10 days.  Return for continued abnormal bleeding.  We discussed plan for pelvic ultrasound and possible EMB to rule out fibroids, polyp, etc. Ibuprofen 600 mg po q 6 hours prn.    An After Visit Summary was printed and given to the patient.  _15_____ minutes face to face time of which over 50% was spent in counseling.

## 2017-03-09 DIAGNOSIS — D649 Anemia, unspecified: Secondary | ICD-10-CM | POA: Diagnosis not present

## 2017-03-09 DIAGNOSIS — N920 Excessive and frequent menstruation with regular cycle: Secondary | ICD-10-CM | POA: Diagnosis not present

## 2017-03-09 DIAGNOSIS — R21 Rash and other nonspecific skin eruption: Secondary | ICD-10-CM | POA: Diagnosis not present

## 2017-03-09 LAB — COMPREHENSIVE METABOLIC PANEL
ALK PHOS: 64 IU/L (ref 39–117)
ALT: 16 IU/L (ref 0–32)
AST: 17 IU/L (ref 0–40)
Albumin/Globulin Ratio: 2.2 (ref 1.2–2.2)
Albumin: 4.3 g/dL (ref 3.5–5.5)
BUN/Creatinine Ratio: 15 (ref 9–23)
BUN: 12 mg/dL (ref 6–24)
Bilirubin Total: 0.5 mg/dL (ref 0.0–1.2)
CALCIUM: 9.4 mg/dL (ref 8.7–10.2)
CO2: 25 mmol/L (ref 20–29)
CREATININE: 0.79 mg/dL (ref 0.57–1.00)
Chloride: 102 mmol/L (ref 96–106)
GFR calc Af Amer: 100 mL/min/{1.73_m2} (ref >59)
GFR, EST NON AFRICAN AMERICAN: 87 mL/min/{1.73_m2} (ref >59)
GLOBULIN, TOTAL: 2 g/dL (ref 1.5–4.5)
GLUCOSE: 86 mg/dL (ref 65–99)
POTASSIUM: 4.2 mmol/L (ref 3.5–5.2)
SODIUM: 141 mmol/L (ref 134–144)
Total Protein: 6.3 g/dL (ref 6.0–8.5)

## 2017-03-09 LAB — CBC
HEMATOCRIT: 36.3 % (ref 34.0–46.6)
Hemoglobin: 12.4 g/dL (ref 11.1–15.9)
MCH: 30.4 pg (ref 26.6–33.0)
MCHC: 34.2 g/dL (ref 31.5–35.7)
MCV: 89 fL (ref 79–97)
Platelets: 212 10*3/uL (ref 150–379)
RBC: 4.08 x10E6/uL (ref 3.77–5.28)
RDW: 12.9 % (ref 12.3–15.4)
WBC: 4.9 10*3/uL (ref 3.4–10.8)

## 2017-03-09 LAB — TSH: TSH: 1.37 u[IU]/mL (ref 0.450–4.500)

## 2017-03-15 DIAGNOSIS — N920 Excessive and frequent menstruation with regular cycle: Secondary | ICD-10-CM | POA: Diagnosis not present

## 2017-03-15 DIAGNOSIS — D649 Anemia, unspecified: Secondary | ICD-10-CM | POA: Diagnosis not present

## 2017-03-15 DIAGNOSIS — Z1389 Encounter for screening for other disorder: Secondary | ICD-10-CM | POA: Diagnosis not present

## 2017-03-15 DIAGNOSIS — R21 Rash and other nonspecific skin eruption: Secondary | ICD-10-CM | POA: Diagnosis not present

## 2017-04-02 ENCOUNTER — Other Ambulatory Visit (HOSPITAL_COMMUNITY): Payer: Self-pay | Admitting: Family Medicine

## 2017-04-02 ENCOUNTER — Ambulatory Visit (HOSPITAL_COMMUNITY)
Admission: RE | Admit: 2017-04-02 | Discharge: 2017-04-02 | Disposition: A | Discharge status: Home / Self Care | Payer: 59 | Source: Ambulatory Visit | Attending: Family Medicine | Admitting: Family Medicine

## 2017-04-02 DIAGNOSIS — M79631 Pain in right forearm: Secondary | ICD-10-CM

## 2017-04-10 DIAGNOSIS — M25531 Pain in right wrist: Secondary | ICD-10-CM | POA: Diagnosis not present

## 2017-05-11 DIAGNOSIS — J209 Acute bronchitis, unspecified: Secondary | ICD-10-CM | POA: Diagnosis not present

## 2017-06-07 DIAGNOSIS — J302 Other seasonal allergic rhinitis: Secondary | ICD-10-CM | POA: Diagnosis not present

## 2017-06-08 ENCOUNTER — Other Ambulatory Visit (HOSPITAL_COMMUNITY): Payer: Self-pay | Admitting: Family Medicine

## 2017-06-08 ENCOUNTER — Ambulatory Visit (HOSPITAL_COMMUNITY)
Admission: RE | Admit: 2017-06-08 | Discharge: 2017-06-08 | Disposition: A | Discharge status: Home / Self Care | Payer: 59 | Source: Ambulatory Visit | Attending: Family Medicine | Admitting: Family Medicine

## 2017-06-08 DIAGNOSIS — R918 Other nonspecific abnormal finding of lung field: Secondary | ICD-10-CM | POA: Insufficient documentation

## 2017-06-08 DIAGNOSIS — M47814 Spondylosis without myelopathy or radiculopathy, thoracic region: Secondary | ICD-10-CM | POA: Diagnosis not present

## 2017-06-08 DIAGNOSIS — R059 Cough, unspecified: Secondary | ICD-10-CM

## 2017-06-08 DIAGNOSIS — R05 Cough: Secondary | ICD-10-CM | POA: Diagnosis not present

## 2017-06-12 DIAGNOSIS — H5203 Hypermetropia, bilateral: Secondary | ICD-10-CM | POA: Diagnosis not present

## 2017-06-18 DIAGNOSIS — R768 Other specified abnormal immunological findings in serum: Secondary | ICD-10-CM | POA: Diagnosis not present

## 2017-06-18 DIAGNOSIS — R5383 Other fatigue: Secondary | ICD-10-CM | POA: Diagnosis not present

## 2017-06-28 DIAGNOSIS — M542 Cervicalgia: Secondary | ICD-10-CM | POA: Diagnosis not present

## 2017-06-28 DIAGNOSIS — M25512 Pain in left shoulder: Secondary | ICD-10-CM | POA: Diagnosis not present

## 2017-07-27 ENCOUNTER — Telehealth: Payer: Self-pay | Admitting: Obstetrics and Gynecology

## 2017-07-27 ENCOUNTER — Encounter: Payer: Self-pay | Admitting: Obstetrics & Gynecology

## 2017-07-27 ENCOUNTER — Other Ambulatory Visit: Payer: Self-pay

## 2017-07-27 ENCOUNTER — Ambulatory Visit: Payer: 59 | Admitting: Obstetrics & Gynecology

## 2017-07-27 VITALS — BP 118/64 | HR 72 | Resp 14 | Wt 167.0 lb

## 2017-07-27 DIAGNOSIS — N920 Excessive and frequent menstruation with regular cycle: Secondary | ICD-10-CM | POA: Diagnosis not present

## 2017-07-27 DIAGNOSIS — N921 Excessive and frequent menstruation with irregular cycle: Secondary | ICD-10-CM

## 2017-07-27 DIAGNOSIS — N926 Irregular menstruation, unspecified: Secondary | ICD-10-CM

## 2017-07-27 LAB — HEMOGLOBIN: HEMOGLOBIN: 13.1

## 2017-07-27 LAB — POCT URINE PREGNANCY: PREG TEST UR: NEGATIVE

## 2017-07-27 MED ORDER — NORETHINDRONE ACETATE 5 MG PO TABS: ORAL_TABLET | ORAL | 0 refills | Status: DC

## 2017-07-27 NOTE — Telephone Encounter (Signed)
Patient called states she is having periods every other week and they are so heavy she is having to change every 30 minutes.  Patietn states that she is weak and exhausted.

## 2017-07-27 NOTE — Telephone Encounter (Signed)
Patient states that she was having irregular bleeding in July and was given rx for Provera 10 mg for 10 days to stop the bleeding. Menses was normal for a couple of months then started coming earlier and earlier. Now coming every 2 weeks and lasting 5 days. Started menses yesterday. Changing pad/tampon every 30 minutes-1 hour due to bleeding through. Feels weak, fatigued, and is having headaches. Has moderate cramping. Advised will review with covering MD and return call with additional recommendations. Is able to be seen today.

## 2017-07-27 NOTE — Telephone Encounter (Signed)
Spoke with patient. Advised have reviewed with Dr.Miller and she will need to be seen for further evaluation. Offered appointment today at 11:30 am but patient declines as she is traveling from [place]. Appointment scheduled for 12:30 pm with Dr.Miller. Patient will arrive at 12:15 pm.  Routing to provider for final review. Patient agreeable to disposition. Will close encounter.

## 2017-07-27 NOTE — Progress Notes (Signed)
GYNECOLOGY  VISIT  CC:   menorrhagia  HPI: 51 y.o. G41P1001 Married Caucasian female here for complaint of heavy bleeding starting yesterday.  Was changing pads and tampons every 30 minutes.  Got up overnight because she felt like she was wet at times.  Using super-plus tampons with pads over the pads two days.    Had similar episode in July and she took provera 10mg  x 10 days.  Then after the Provera, cycle was regular for two months.  Then bleeding has become more irregular and then she skipped   About a week ago, she had four or five days of regular bleeding.  Had no PMS symptoms before this.  This stopped and then restarted again with some spotting on Wednesday night.  Then the bleeding really got heavy as above.  Having PMS symptoms this month as well.  GYNECOLOGIC HISTORY: Patient's last menstrual period was 07/15/2017. Contraception: sometimes condoms  Patient Active Problem List   Diagnosis Date Noted  . Special screening for malignant neoplasms, colon     Past Medical History:  Diagnosis Date  . Medical history non-contributory     Past Surgical History:  Procedure Laterality Date  . CESAREAN SECTION  1990  . COLONOSCOPY N/A 12/04/2016   Procedure: COLONOSCOPY;  Surgeon: Danie Binder, MD;  Location: [facility];  Service: Endoscopy;  Laterality: N/A;  11:15 AM    MEDS:   No current outpatient medications on file prior to visit.   No current facility-administered medications on file prior to visit.     ALLERGIES: Prednisone  Family History  Problem Relation Age of Onset  . Thyroid disease Mother   . Stroke Mother   . Hypertension Father   . Cancer Maternal Grandmother        Hx Thyroid Ca  . Cancer Maternal Grandfather        unknown origin  . Diabetes Paternal Grandmother   . Hyperlipidemia Paternal Grandfather   . Thyroid disease Maternal Aunt   . Colon cancer Cousin     SH:  Married, non smoker  Review of Systems  Constitutional: Negative.    Respiratory: Negative.   Cardiovascular: Negative.   Neurological: Negative.   Psychiatric/Behavioral: Negative.     PHYSICAL EXAMINATION:    BP 118/64 (BP Location: Right Arm, Patient Position: Sitting, Cuff Size: Normal)   Pulse 72   Resp 14   Wt 167 lb (75.8 kg)   LMP 07/15/2017   BMI 28.67 kg/m     General appearance: alert, cooperative and appears stated age CV:  Regular rate and rhythm Lungs:  clear to auscultation, no wheezes, rales or rhonchi, symmetric air entry Abdomen: soft, non-tender; bowel sounds normal; no masses,  no organomegaly  Pelvic: External genitalia:  no lesions              Urethra:  normal appearing urethra with no masses, tenderness or lesions              Bartholins and Skenes: normal                 Vagina: normal appearing vagina with normal color and discharge, no lesions              Cervix: no lesions and active bleeding with small clots noted              Bimanual Exam:  Uterus:  normal size, contour, position, consistency, mobility, non-tender  Adnexa: no mass, fullness, tenderness              Anus:  normal sphincter tone, no lesions  Chaperone was present for exam.  Assessment: Menorrhagia due to oligoovulation  Plan: CBC, iron and ferritin level obtained today Endometrial biopsy pending Will schedule ultrasound for pt  Aygestin 5mg  BID until bleeding stops and then daily until pt decides next treatment plan.  D/w pt POPs, D&C, Mirena IUD, hysterectomy.  Information given.

## 2017-07-28 LAB — CBC
HEMATOCRIT: 39.6 % (ref 34.0–46.6)
Hemoglobin: 13.2 g/dL (ref 11.1–15.9)
MCH: 30.3 pg (ref 26.6–33.0)
MCHC: 33.3 g/dL (ref 31.5–35.7)
MCV: 91 fL (ref 79–97)
Platelets: 225 10*3/uL (ref 150–379)
RBC: 4.35 x10E6/uL (ref 3.77–5.28)
RDW: 13.4 % (ref 12.3–15.4)
WBC: 5.2 10*3/uL (ref 3.4–10.8)

## 2017-07-28 LAB — FERRITIN: Ferritin: 23 ng/mL (ref 15–150)

## 2017-07-28 LAB — IRON: Iron: 69 ug/dL (ref 27–159)

## 2017-07-30 ENCOUNTER — Telehealth: Payer: Self-pay | Admitting: Obstetrics & Gynecology

## 2017-07-30 NOTE — Telephone Encounter (Signed)
Reviewed with Dr. Sabra Heck- may also try 1/2 tab Aygestin. Labs dated 07/27/17 normal, pathology pending.   Spoke with patient, advised per Dr. Sabra Heck. Patient states she would like to try 1/2 tab of Aygestin and discuss options further at 12/20 OV.   Advised patient to return call with any additional questions or concerns. Patient thankful for f/u and verbalizes understanding.   Routing to provider for final review. Patient is agreeable to disposition. Will close encounter.

## 2017-07-30 NOTE — Telephone Encounter (Signed)
Patient is having some symptoms after taking progesterone. Patient is having an "upset stomach and headache"an hour after taking theprogesterone.  Patient is asking if she should continue taking the progesterone until her next appointment 08/02/17?

## 2017-07-30 NOTE — Telephone Encounter (Signed)
Spoke with patient. Started Aygestin bid on 12/14, now taking daily, bleeding has stopped. Patient reports headache and diarrhea within 1 hour after taking medication, resolves by next dose. Taking late afternoon.   Scheduled for PUS on 08/02/17.   Patient aware bleeding may restart if she stops Aygestin, would like Dr. Sabra Heck to be aware, any alternatives?   Advised will review with Dr. Sabra Heck and return call, patient is agreeable.   Dr. Sabra Heck -please review and advise?

## 2017-07-30 NOTE — Telephone Encounter (Signed)
Ok to transition to [employer] daily.  This is much less progesterone and will hopefully keep her from bleeding.  I would continue this until she is fully in menopause if tolerates ok.  OK to send in RX.  Call if bleeding returned.  Follow-up 3 months for recheck.

## 2017-08-01 ENCOUNTER — Other Ambulatory Visit: Payer: Self-pay | Admitting: *Deleted

## 2017-08-01 DIAGNOSIS — N926 Irregular menstruation, unspecified: Secondary | ICD-10-CM

## 2017-08-01 DIAGNOSIS — N921 Excessive and frequent menstruation with irregular cycle: Secondary | ICD-10-CM

## 2017-08-02 ENCOUNTER — Ambulatory Visit (INDEPENDENT_AMBULATORY_CARE_PROVIDER_SITE_OTHER): Payer: 59

## 2017-08-02 ENCOUNTER — Ambulatory Visit (INDEPENDENT_AMBULATORY_CARE_PROVIDER_SITE_OTHER): Payer: 59 | Admitting: Obstetrics & Gynecology

## 2017-08-02 ENCOUNTER — Telehealth: Payer: Self-pay | Admitting: Obstetrics & Gynecology

## 2017-08-02 VITALS — BP 102/70 | HR 68 | Resp 16 | Ht 64.0 in | Wt 167.0 lb

## 2017-08-02 DIAGNOSIS — N921 Excessive and frequent menstruation with irregular cycle: Secondary | ICD-10-CM

## 2017-08-02 DIAGNOSIS — N926 Irregular menstruation, unspecified: Secondary | ICD-10-CM | POA: Diagnosis not present

## 2017-08-02 NOTE — Telephone Encounter (Signed)
Spoke with patient regarding benefit for scheduled Mirena IUD insertion. Patient understood and agreeable. Patient scheduled 08/09/17 with Dr Sabra Heck. Patient aware of appointment date, arrival time and cancellation policy. No further questions. Ok to close.   cc: Dr Sabra Heck

## 2017-08-02 NOTE — Progress Notes (Signed)
51 y.o. G1P1001 Married Caucasian female here for pelvic ultrasound due to menorrhagia that she was experiencing last week.  Endometrial biopsy was negative.  Pt started on Aygestin and this worked really well but the 5mg  BID dosing caused headaches.  She is taking 1/2 tab daily.  Would like hormonal method that has the least hormones.  IUD discussed.  Pt very comfortable about that decision..  Patient's last menstrual period was 07/15/2017.  Contraception: none  Findings:  UTERUS: 10.1 x 6.2 x 5.5cm with 1.6 x 1.2cm and 0.8 x 0.6cm fibroids EMS:6.73mm ADNEXA: Left ovary: 2.5 x 2.2 x 1.5cm       Right ovary: 2.8 x 1.8 x 1.4cm CUL DE SAC: no free fluid  Discussion:  Findings reviewed with pt.  Treatment for bleeding discussed.  She would like to have an IUD placed.  Wants to have precert done first.  Will plan for next week for placement.  Pt know to use condoms until IUD placed.  Assessment:  H/o DUB with menorrhagia Side effects with Aygestin  Plan:  Return for IUD insertion next week.  Will precert first.   ~15 minutes spent with patient >50% of time was in face to face discussion of above.

## 2017-08-03 ENCOUNTER — Encounter: Payer: Self-pay | Admitting: Obstetrics & Gynecology

## 2017-08-09 ENCOUNTER — Other Ambulatory Visit: Payer: Self-pay

## 2017-08-09 ENCOUNTER — Ambulatory Visit (INDEPENDENT_AMBULATORY_CARE_PROVIDER_SITE_OTHER): Payer: 59 | Admitting: Obstetrics & Gynecology

## 2017-08-09 ENCOUNTER — Encounter: Payer: Self-pay | Admitting: Obstetrics & Gynecology

## 2017-08-09 VITALS — BP 108/70 | HR 68 | Resp 16 | Ht 64.0 in | Wt 171.0 lb

## 2017-08-09 DIAGNOSIS — Z30430 Encounter for insertion of intrauterine contraceptive device: Secondary | ICD-10-CM | POA: Diagnosis not present

## 2017-08-09 DIAGNOSIS — N926 Irregular menstruation, unspecified: Secondary | ICD-10-CM

## 2017-08-09 LAB — POCT URINE PREGNANCY: Preg Test, Ur: NEGATIVE

## 2017-08-12 NOTE — Progress Notes (Signed)
51 y.o. G71P1001 Married Caucasian female presents for insertion of Mirena IUD due to irregular bleeding and need for contraception.  Pt has episode of heavy bleeding in early December.  Evaluation was negative including ultrasound and endometrial biopsy.  Ultrasound did show some small fibroids.  Pt is not interested in anything surgical.  Pt has been counseled about alternative forms of contraception including OCPs, progesterone options, sterilization procedures, condoms, and natural family planning.  She feels IUD is the better option for her.  Pt has also been counseled about risks and benefits as well as complications.  Consent is obtained today.  All questions answered prior to start of procedure.    Current contraception: none.  Not SA since before bleeding episode earlier this month. Last STD testing:  Not indicated due to long term relationship/marriage  LMP:  Patient's last menstrual period was 07/31/2017.  Patient Active Problem List   Diagnosis Date Noted  . Special screening for malignant neoplasms, colon    Past Medical History:  Diagnosis Date  . Medical history non-contributory    No current outpatient medications on file prior to visit.   No current facility-administered medications on file prior to visit.    Prednisone  Review of Systems  All other systems reviewed and are negative.  Vitals:   08/09/17 1106  BP: 108/70  Pulse: 68  Resp: 16  Weight: 171 lb (77.6 kg)  Height: 5\' 4"  (1.626 m)    Gen:  WNWF healthy female NAD Abdomen: soft, non-tender Groin:  no inguinal nodes palpated  Pelvic exam: Vulva:  normal female genitalia Vagina:  normal vagina Cervix:  Non-tender, Negative CMT, no lesions or redness. Uterus:  normal shape, position and consistency   Procedure:  Speculum reinserted.  Cervix visualized and cleansed with Betadine x 3.  Paracervical block was not placed.  Single toothed tenaculum applied to anterior lip of cervix without difficulty.   Uterus sounded to 8cm.  Mirena IUD package was opened.  IUD and introducer passed to fundus and then withdrawn slightly before IUD was passed into endometrial cavity.  Introducer removed.  Strings cut to 2cm.  Tenaculum removed from cervix.  Minimal bleeding noted.  Pt tolerated the procedure well.  All instruments removed from vagina.  A: Insertion of Mirena IUD Contraception desires Menorrhagia  P:  Return for recheck 6-8 weeks Pt aware to call for any concerns Pt aware removal due no later than 08/10/2022.  IUD card given to pt.

## 2017-08-31 ENCOUNTER — Encounter: Payer: Self-pay | Admitting: Obstetrics & Gynecology

## 2017-08-31 ENCOUNTER — Ambulatory Visit: Payer: 59 | Admitting: Obstetrics & Gynecology

## 2017-08-31 ENCOUNTER — Telehealth: Payer: Self-pay | Admitting: Obstetrics & Gynecology

## 2017-08-31 VITALS — BP 106/70 | HR 88 | Temp 98.3°F | Resp 14 | Ht 64.0 in | Wt 170.0 lb

## 2017-08-31 DIAGNOSIS — N926 Irregular menstruation, unspecified: Secondary | ICD-10-CM

## 2017-08-31 LAB — POCT URINE PREGNANCY: Preg Test, Ur: NEGATIVE

## 2017-08-31 NOTE — Telephone Encounter (Signed)
Patient is bleeding heavily and feeling weak.

## 2017-08-31 NOTE — Telephone Encounter (Signed)
Call to patient. Advised of work in appointment today with Dr Sabra Heck at 215. Patient agreeable.  Routing to provider for final review. Patient agreeable to disposition. Will close encounter.

## 2017-08-31 NOTE — Telephone Encounter (Signed)
Spoke with patient. Patient states she had and Mirena placed on 08/09/2017 for irregular bleeding. States on 08/23/2017 she began having spotting that lasted a few days. Then bleeding increased. On 08/29/2017 she bled thru a tampon, pad, and her jeans at work. Wednesday and Thursday she was changing her tampon every 40 minutes due to it being full. Woke up this AM and had bled through her tampon. Today is changing her tampon every hour. Feels tired and weak. Advised will need to be seen for further evaluation. Will review with Dr.Miller and return call. Patient is agreeable.

## 2017-08-31 NOTE — Telephone Encounter (Signed)
Left message to call [facility] at [phone].  Spoke with Dr.Miller. Patient will need to be seen today at 2:15 pm for work in appointment.

## 2017-08-31 NOTE — Progress Notes (Signed)
GYNECOLOGY  VISIT  CC:   Vaginal bleeding  HPI: 52 y.o. G71P1001 Married Caucasian female here for irregular vag bleeding.  Mirena IUD was placed 08/09/17 and pt has almost no bleeding after this was placed until the last three days where she's had several episodes of heavy bleeding that then slacks off and decreases to almost nothing.  When this happened again today, she felt she should be seen.    Denies SOB, palpitations, pelvic pain, vaginal discharge.  Use norethindrone before IUD was placed due to heavy bleeding she was experiencing.    GYNECOLOGIC HISTORY: Patient's last menstrual period was 08/23/2017. Contraception: Mirena IUD placed 08/09/17 Menopausal hormone therapy: none  Patient Active Problem List   Diagnosis Date Noted  . Special screening for malignant neoplasms, colon     Past Medical History:  Diagnosis Date  . Medical history non-contributory     Past Surgical History:  Procedure Laterality Date  . CESAREAN SECTION  1990  . COLONOSCOPY N/A 12/04/2016   Procedure: COLONOSCOPY;  Surgeon: Danie Binder, MD;  Location: [facility];  Service: Endoscopy;  Laterality: N/A;  11:15 AM    MEDS:   No current outpatient medications on file prior to visit.   No current facility-administered medications on file prior to visit.     ALLERGIES: Prednisone  Family History  Problem Relation Age of Onset  . Thyroid disease Mother   . Stroke Mother   . Hypertension Father   . Cancer Maternal Grandmother        Hx Thyroid Ca  . Cancer Maternal Grandfather        unknown origin  . Diabetes Paternal Grandmother   . Hyperlipidemia Paternal Grandfather   . Thyroid disease Maternal Aunt   . Colon cancer Cousin     SH:  Married, non smoker  Review of Systems  All other systems reviewed and are negative.   PHYSICAL EXAMINATION:    BP 106/70 (BP Location: Right Arm, Patient Position: Sitting, Cuff Size: Normal)   Pulse 88   Temp 98.3 F (36.8 C) (Oral)    Resp 14   Ht 5\' 4"  (1.626 m)   Wt 170 lb (77.1 kg)   LMP 08/23/2017   BMI 29.18 kg/m     General appearance: alert, cooperative and appears stated age CV:  Regular rate and rhythm Lungs:  clear to auscultation, no wheezes, rales or rhonchi, symmetric air entry Abdomen: soft, non-tender; bowel sounds normal; no masses,  no organomegaly  Pelvic: External genitalia:  no lesions              Urethra:  normal appearing urethra with no masses, tenderness or lesions              Bartholins and Skenes: normal                 Vagina: normal appearing vagina with normal color and discharge, no lesions              Cervix: no lesions and No IUD string noted              Bimanual Exam:  Uterus:  normal size, contour, position, consistency, mobility, non-tender              Adnexa: no mass, fullness, tenderness              Anus:  no lesions  Bedside ultrasound done.  Difficult to see fundus of uterus but IUD is within endometrial cavity.  Chaperone was present for exam.  Assessment: Irregular bleeding  Plan: Pt is going to return for PUS to assess IUD placement. Has Aygestin at home left over from December.  If heavy bleeding resumes, she is going to start this daily.   ~15 minutes spent with patient >50% of time was in face to face discussion of above.

## 2017-09-03 ENCOUNTER — Telehealth: Payer: Self-pay | Admitting: *Deleted

## 2017-09-03 DIAGNOSIS — N926 Irregular menstruation, unspecified: Secondary | ICD-10-CM

## 2017-09-03 LAB — HEMOGLOBIN: HEMOGLOBIN: 12.5

## 2017-09-03 NOTE — Telephone Encounter (Signed)
-----   Message from Megan Salon, MD sent at 09/02/2017  6:32 PM EST ----- Regarding: bleeding follow up Please call pt and see how her bleeding over the weekend was.  I did a quick ultrasound on Friday and could not fully see the fundus of the uterus.  Her IUD was in the uterus but I could not see the strings at the cervix.  I'd like her to return for PUS to make sure placement is correct.  Can you please call pt and get update as well as scheduling ultrasound.  Thanks.  Vinnie Level

## 2017-09-03 NOTE — Telephone Encounter (Signed)
Spoke with patient. Reports bleeding stopped on 09/02/17, denies any other symptoms.   PUS scheduled for 09/13/17 at 2:30pm with consult at 3pm with Dr. Sabra Heck. Attempted to schedule patient for earlier appt, patient declined 1/24, 1/29.   Advised patient will update Dr. Sabra Heck and return call with any additional recommendations. Return call to office should new symptoms develop or bleeding restart. Patient verbalizes understanding and is agreeable.   Routing to Dr. Sabra Heck.

## 2017-09-03 NOTE — Telephone Encounter (Signed)
Patient returned call requesting to reschedule PUS d/t prior commitment. PUS scheduled for 1/29 at 3:30pm with consult to follow at 4pm with Dr. Sabra Heck.    PUS and OV cancelled for 1/31. Patient is agreeable to date and time.   Routing to Dr. Sabra Heck

## 2017-09-06 ENCOUNTER — Telehealth: Payer: Self-pay | Admitting: Obstetrics & Gynecology

## 2017-09-06 NOTE — Telephone Encounter (Signed)
Call placed to patient to review benefits for scheduled ultrasound. Left voicemail message requesting a return call °

## 2017-09-10 NOTE — Telephone Encounter (Signed)
Spoke with patient in regarding to benefits for scheduled ultrasound.Patient understood information presented (see account notes). Patient is scheduled 09/11/17 with Dr Sabra Heck. Patient is aware of the appointment date and arrival time and had no further questions. Ok to close

## 2017-09-11 ENCOUNTER — Other Ambulatory Visit: Payer: Self-pay | Admitting: Obstetrics & Gynecology

## 2017-09-11 ENCOUNTER — Telehealth: Payer: Self-pay | Admitting: Obstetrics & Gynecology

## 2017-09-11 ENCOUNTER — Other Ambulatory Visit: Payer: Self-pay

## 2017-09-11 NOTE — Telephone Encounter (Signed)
Thank you for message.  Agree with your decisions.

## 2017-09-11 NOTE — Telephone Encounter (Signed)
Patient called and cancelled her ultrasound today. I did not count this as a missed appointment because she said she thinks she may be coming down with the flu and she's headed to her family doctor's office now. The patient sounded close to tears and further explained she's been under a lot of stress due to her son attempting suicide recently and not working for the last week because of it. Routing to doctor for review.  Cc: Suzy for rescheduling.

## 2017-09-13 ENCOUNTER — Other Ambulatory Visit: Payer: Self-pay

## 2017-09-13 ENCOUNTER — Other Ambulatory Visit: Payer: Self-pay | Admitting: Obstetrics & Gynecology

## 2017-09-13 ENCOUNTER — Ambulatory Visit: Payer: 59 | Admitting: Obstetrics & Gynecology

## 2017-10-09 NOTE — Telephone Encounter (Signed)
Routing to [place] for rescheduling.

## 2017-10-22 ENCOUNTER — Telehealth: Payer: Self-pay | Admitting: Obstetrics & Gynecology

## 2017-10-22 NOTE — Telephone Encounter (Signed)
See new telephone note from 10/22/17.

## 2017-10-22 NOTE — Telephone Encounter (Signed)
Call placed to patient to reschedule ultrasound appointment she had previously canceled. Left voicemail message requesting a return call for scheduling.     cc: Dr Sabra Heck

## 2017-11-06 ENCOUNTER — Other Ambulatory Visit: Payer: Self-pay

## 2017-11-06 ENCOUNTER — Encounter: Payer: Self-pay | Admitting: Obstetrics & Gynecology

## 2017-11-06 ENCOUNTER — Ambulatory Visit (INDEPENDENT_AMBULATORY_CARE_PROVIDER_SITE_OTHER): Payer: 59 | Admitting: Obstetrics & Gynecology

## 2017-11-06 VITALS — BP 100/70 | HR 88 | Resp 16 | Ht 63.75 in | Wt 173.0 lb

## 2017-11-06 DIAGNOSIS — Z01419 Encounter for gynecological examination (general) (routine) without abnormal findings: Secondary | ICD-10-CM | POA: Diagnosis not present

## 2017-11-06 DIAGNOSIS — Z1231 Encounter for screening mammogram for malignant neoplasm of breast: Secondary | ICD-10-CM | POA: Diagnosis not present

## 2017-11-06 NOTE — Progress Notes (Signed)
52 y.o. G1P1001 MarriedCaucasianF here for annual exam.  Had IUD placed 08/09/17.  Did have a cycle in January that was heavy with clot.  Did have a regular flow in February.  This was lighter than in January.  March's cycle was lighter still, lasted 7 days but real flow was present for about four days.  Just had spotting for three more days.  Only wore a panty liner but there was very little blood on the panty liner.    Having some increased issues with hip pain.    PCP:  Dr. Hilma Favors.  Last appt was last year for a physical.    Patient's last menstrual period was 10/23/2017.          Sexually active: Yes.    The current method of family planning is IUD. Mirena placed 08/09/17    Exercising: Yes.    weights, walking Smoker:  no  Health Maintenance: Pap:  11/02/16 Neg. HR HPV:neg History of abnormal Pap:  no MMG:  11/02/16 BIRADS1:neg. Had appt this morning at [facility] Colonoscopy:  12/04/16 Normal. f/u 10 years  BMD:   Never TDaP: 2017? (with PCP) Pneumonia vaccine(s):  n/a Shingrix: Zostavax 2017 Hep C testing: n/a Screening Labs: PCP   reports that she has never smoked. She has never used smokeless tobacco. She reports that she drinks alcohol. She reports that she does not use drugs.  Past Medical History:  Diagnosis Date  . Medical history non-contributory     Past Surgical History:  Procedure Laterality Date  . CESAREAN SECTION  1990  . COLONOSCOPY N/A 12/04/2016   Procedure: COLONOSCOPY;  Surgeon: Danie Binder, MD;  [facility]: [facility];  Service: Endoscopy;  Laterality: N/A;  11:15 AM    Current Outpatient Medications  Medication Sig Dispense Refill  . Multiple Vitamin (MULTIVITAMIN) tablet Take 1 tablet by mouth daily.    . Omega-3 Fatty Acids (FISH OIL) 1000 MG CPDR Take by mouth daily.    [employer] Specialty Vitamins Products (ULTIMATE FAT BURNER) TABS Take by mouth daily.    . Turmeric 500 MG CAPS Take by mouth daily.     No current facility-administered medications for  this visit.     Family History  Problem Relation Age of Onset  . Thyroid disease Mother   . Stroke Mother   . Hypertension Father   . Cancer Maternal Grandmother        Hx Thyroid Ca  . Cancer Maternal Grandfather        unknown origin  . Diabetes Paternal Grandmother   . Hyperlipidemia Paternal Grandfather   . Thyroid disease Maternal Aunt   . Colon cancer Cousin   . Breast cancer Maternal Aunt   . Thyroid disease Maternal Aunt     Review of Systems  Constitutional:       Weight gain  Gastrointestinal:       Bloating   All other systems reviewed and are negative.   Exam:   BP 100/70 (BP Location: Left Arm, Patient Position: Sitting, Cuff Size: Large)   Pulse 88   Resp 16   Ht 5' 3.75" (1.619 m)   Wt 173 lb (78.5 kg)   LMP 10/23/2017   BMI 29.93 kg/m    Height: 5' 3.75" (161.9 cm)  Ht Readings from Last 3 Encounters:  11/06/17 5' 3.75" (1.619 m)  08/31/17 5\' 4"  (1.626 m)  08/09/17 5\' 4"  (1.626 m)   General appearance: alert, cooperative and appears stated age Head: Normocephalic, without obvious  abnormality, atraumatic Neck: no adenopathy, supple, symmetrical, trachea midline and thyroid normal to inspection and palpation Lungs: clear to auscultation bilaterally Breasts: normal appearance, no masses or tenderness Heart: regular rate and rhythm Abdomen: soft, non-tender; bowel sounds normal; no masses,  no organomegaly Extremities: extremities normal, atraumatic, no cyanosis or edema Skin: Skin color, texture, turgor normal. No rashes or lesions Lymph nodes: Cervical, supraclavicular, and axillary nodes normal. No abnormal inguinal nodes palpated Neurologic: Grossly normal   Pelvic: External genitalia:  no lesions              Urethra:  normal appearing urethra with no masses, tenderness or lesions              Bartholins and Skenes: normal                 Vagina: normal appearing vagina with normal color and discharge, no lesions              Cervix: no  lesions, IUD string noted              Pap taken: No. Bimanual Exam:  Uterus:  normal size, contour, position, consistency, mobility, non-tender              Adnexa: normal adnexa and no mass, fullness, tenderness               Rectovaginal: Confirms               Anus:  normal sphincter tone, no lesions  Chaperone was present for exam.  A:  Well Woman with normal exam H/O menorrhagia s/p IUD placement 12/18 Frustrations with weight   P:   Mammogram guidelines reviewed.  Done this morning. pap smear and HR HPV neg 3/18 Lab work will be done with Dr. Hilma Favors in a few weeks She will check about last tetanus shot BMD and colonoscopy are UTD return annually or prn

## 2017-11-12 ENCOUNTER — Encounter: Payer: Self-pay | Admitting: Obstetrics & Gynecology

## 2017-11-12 DIAGNOSIS — J069 Acute upper respiratory infection, unspecified: Secondary | ICD-10-CM | POA: Diagnosis not present

## 2017-11-22 DIAGNOSIS — Z1389 Encounter for screening for other disorder: Secondary | ICD-10-CM | POA: Diagnosis not present

## 2017-11-22 DIAGNOSIS — E663 Overweight: Secondary | ICD-10-CM | POA: Diagnosis not present

## 2017-11-22 DIAGNOSIS — Z0000 Encounter for general adult medical examination without abnormal findings: Secondary | ICD-10-CM | POA: Diagnosis not present

## 2017-11-22 DIAGNOSIS — E785 Hyperlipidemia, unspecified: Secondary | ICD-10-CM | POA: Diagnosis not present

## 2018-01-01 DIAGNOSIS — L309 Dermatitis, unspecified: Secondary | ICD-10-CM | POA: Diagnosis not present

## 2018-01-01 DIAGNOSIS — L0100 Impetigo, unspecified: Secondary | ICD-10-CM | POA: Diagnosis not present

## 2018-01-01 DIAGNOSIS — L280 Lichen simplex chronicus: Secondary | ICD-10-CM | POA: Diagnosis not present

## 2018-01-01 DIAGNOSIS — T7840XA Allergy, unspecified, initial encounter: Secondary | ICD-10-CM | POA: Diagnosis not present

## 2018-01-01 DIAGNOSIS — T148XXA Other injury of unspecified body region, initial encounter: Secondary | ICD-10-CM | POA: Diagnosis not present

## 2018-01-21 DIAGNOSIS — L309 Dermatitis, unspecified: Secondary | ICD-10-CM | POA: Diagnosis not present

## 2018-01-21 DIAGNOSIS — L0100 Impetigo, unspecified: Secondary | ICD-10-CM | POA: Diagnosis not present

## 2018-03-13 DIAGNOSIS — H5203 Hypermetropia, bilateral: Secondary | ICD-10-CM | POA: Diagnosis not present

## 2018-04-01 DIAGNOSIS — L982 Febrile neutrophilic dermatosis [Sweet]: Secondary | ICD-10-CM | POA: Diagnosis not present

## 2018-05-15 DIAGNOSIS — R222 Localized swelling, mass and lump, trunk: Secondary | ICD-10-CM | POA: Diagnosis not present

## 2018-05-29 DIAGNOSIS — R222 Localized swelling, mass and lump, trunk: Secondary | ICD-10-CM | POA: Diagnosis not present

## 2018-06-17 DIAGNOSIS — M7581 Other shoulder lesions, right shoulder: Secondary | ICD-10-CM | POA: Diagnosis not present

## 2018-06-17 DIAGNOSIS — S83281A Other tear of lateral meniscus, current injury, right knee, initial encounter: Secondary | ICD-10-CM | POA: Diagnosis not present

## 2018-07-03 ENCOUNTER — Other Ambulatory Visit: Payer: Self-pay | Admitting: General Surgery

## 2018-07-03 DIAGNOSIS — M25561 Pain in right knee: Secondary | ICD-10-CM | POA: Diagnosis not present

## 2018-07-03 DIAGNOSIS — D171 Benign lipomatous neoplasm of skin and subcutaneous tissue of trunk: Secondary | ICD-10-CM | POA: Diagnosis not present

## 2018-07-04 DIAGNOSIS — M542 Cervicalgia: Secondary | ICD-10-CM | POA: Diagnosis not present

## 2018-07-05 ENCOUNTER — Ambulatory Visit (HOSPITAL_COMMUNITY): Payer: 59 | Attending: Orthopedic Surgery

## 2018-07-05 ENCOUNTER — Other Ambulatory Visit: Payer: Self-pay

## 2018-07-05 ENCOUNTER — Encounter (HOSPITAL_COMMUNITY): Payer: Self-pay

## 2018-07-05 DIAGNOSIS — M62838 Other muscle spasm: Secondary | ICD-10-CM | POA: Diagnosis not present

## 2018-07-05 DIAGNOSIS — R29898 Other symptoms and signs involving the musculoskeletal system: Secondary | ICD-10-CM | POA: Diagnosis present

## 2018-07-05 DIAGNOSIS — M542 Cervicalgia: Secondary | ICD-10-CM | POA: Diagnosis not present

## 2018-07-05 DIAGNOSIS — M25511 Pain in right shoulder: Secondary | ICD-10-CM
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
## 2018-07-08 ENCOUNTER — Ambulatory Visit (HOSPITAL_COMMUNITY): Payer: 59

## 2018-07-08 ENCOUNTER — Encounter (HOSPITAL_COMMUNITY): Payer: Self-pay

## 2018-07-08 DIAGNOSIS — M25511 Pain in right shoulder: Secondary | ICD-10-CM

## 2018-07-08 DIAGNOSIS — R29898 Other symptoms and signs involving the musculoskeletal system: Secondary | ICD-10-CM

## 2018-07-08 DIAGNOSIS — M542 Cervicalgia: Secondary | ICD-10-CM | POA: Diagnosis not present

## 2018-07-08 DIAGNOSIS — M62838 Other muscle spasm: Secondary | ICD-10-CM

## 2018-07-08 NOTE — Therapy (Signed)
[place], [place], [address] Phone: [phone]   Fax:  [phone]  Physical Therapy Treatment  Patient Details  Name: Jenny Howard MRN: [id number] Date of Birth: [birth year] Referring Provider (PT): Elsie Saas, MD   Encounter Date: 07/08/2018  PT End of Session - 07/08/18 1426    Visit Number  2    Number of Visits  9    Date for PT Re-Evaluation  08/02/18    Authorization Type  [place] Time Period  07/05/18 to 08/02/18    Authorization - Visit Number  2    Authorization - Number of Visits  60   PT/OT/SLP combined, calendar year   PT Start Time  1427    PT Stop Time  1510    PT Time Calculation (min)  43 min    Activity Tolerance  Patient tolerated treatment well;No increased pain    Behavior During Therapy  WFL for tasks assessed/performed       Past Medical History:  Diagnosis Date  . Medical history non-contributory     Past Surgical History:  Procedure Laterality Date  . CESAREAN SECTION  1990  . COLONOSCOPY N/A 12/04/2016   Procedure: COLONOSCOPY;  Surgeon: Danie Binder, MD;  [facility];  Service: Endoscopy;  Laterality: N/A;  11:15 AM    There were no vitals filed for this visit.  Subjective Assessment - 07/08/18 1429    Subjective  Pt states that she is feeling better overall. The needling took her RUE referred pain away. She states that she has her lipoma surgery scheduled for 07/22/18.    Limitations  Lifting;House hold activities;Sitting    How long can you sit comfortably?  can't    How long can you stand comfortably?  can't; has to get in position she is leaning    How long can you walk comfortably?  no issues    Diagnostic tests  MRI knee, x-ray neck/shoulder    Patient Stated Goals  get pain down    Currently in Pain?  Yes    Pain Score  3     Pain Location  Shoulder    Pain Orientation  Right;Lateral    Pain Descriptors / Indicators   Aching;Dull    Pain Type  Acute pain    Pain Onset  1 to 4 weeks ago    Pain Frequency  Constant    Aggravating Factors   moving it, hanging down by side    Pain Relieving Factors  leaning over and back, hands over head, heat and TENS    Effect of Pain on Daily Activities  significant impact         [facility] PT Assessment - 07/08/18 0001      Observation/Other Assessments   Focus on Therapeutic Outcomes (FOTO)   41% limitation    Neck Disability Index   14/50      Special Tests    Special Tests  Cervical    Cervical Tests  other      other    Findings  Negative    Side  Right    Comment  RUE median, ulnar, radial nerve glides          [facility] Adult PT Treatment/Exercise - 07/08/18 0001      Manual Therapy   Manual Therapy  Soft tissue mobilization;Joint mobilization    Manual therapy comments  separate rest of treatment  Joint Mobilization  Grade II-III CPAs to Cervical and thoracic spine for mobility and reduced pain    Soft tissue mobilization  STM after needling to R trapezius throughout, levator scap, rhomboids to reduce pain and restrictions       Trigger Point Dry Needling - 07/08/18 1518    Consent Given?  Yes    Education Handout Provided  Yes    Muscles Treated Upper Body  Upper trapezius;Rhomboids;Infraspinatus    Upper Trapezius Response  Twitch reponse elicited;Palpable increased muscle length   R in prone   Rhomboids Response  Twitch response elicited;Palpable increased muscle length   middle and lower trap   Infraspinatus Response  Twitch response elicited;Palpable increased muscle length   R in prone            PT Education - 07/08/18 1425    Education Details  reviewed goals, continue HEP    Person(s) Educated  Patient    Methods  Explanation;Demonstration;Handout    Comprehension  Verbalized understanding;Returned demonstration       PT Short Term Goals - 07/05/18 1511      PT SHORT TERM GOAL #1   Title  Pt will have improved R grip  strength to within 5# or better to that of her L hand in order to demo improved overall function.    Time  2    Period  Weeks    Status  New    Target Date  07/19/18      PT SHORT TERM GOAL #2   Title  Pt will have improved cervical AROM by 10deg throughout flexion, extension, and R rotation in order to demo reduced soft tissue restrictions throughout cervical and periscap mm and reduce her overall pain.    Time  2    Period  Weeks    Status  New      PT SHORT TERM GOAL #3   Title  Pt will report being able to perform work duties for 4 hours with 5/10 R shoulder/neck pain and minimal RUE referred pain in order to maximize her function at work and demo progress towards PLOF.     Time  2    Period  Weeks    Status  New        PT Long Term Goals - 07/05/18 1512      PT LONG TERM GOAL #1   Title  Pt will have improved MMT to 5/5 throughout and without pain to demo improved overall function.    Time  4    Period  Weeks    Status  New    Target Date  08/02/18      PT LONG TERM GOAL #2   Title  Pt will report being able to sleep on her R side without awakening due to pain in order to demo improved function and maximize her overall recovery.    Time  4    Period  Weeks    Status  New      PT LONG TERM GOAL #3   Title  Pt will report being able to perform work duties for a full day with 3/10 R shoulder/neck pain and no referred pain in order to further demo improved function and return to PLOF.     Time  4    Period  Weeks    Status  New      PT LONG TERM GOAL #4   Title  Pt will score 14/50 or < on the  NDI in order to demo reduced self-perceived disability due to her neck pain.     Time  4    Period  Weeks    Status  New            Plan - 07/08/18 1517    Clinical Impression Statement  Began session by reviewing goals and administering FOTO and NDI. Pt scored 14/50 on the NDI, meeting that LTG for her already. Pt presenting to therapy not reporting any  shooting/referred pain this date and also reporting that she only had minor shooting pains over the weekend which she noticed occurred when she was using her RUE a lot. Assessed nerve glides this date and pt was negative for all, further indicating that her symptoms are MSK in nature and not radicular/neurological. Continued with dry needling to pt's R upper trap, middle trap, lower trap and initiated it on R infraspinatus to futher reduce pain, pain with movement, and referred pain. Followed with manual STM to further promote reduced pain and ended with CPAs to cervical and thoracic spine for mobility. Pt reported reduced pain at EOS. Continue as planned, progressing as able.     Rehab Potential  Good    PT Frequency  2x / week    PT Duration  4 weeks    PT Treatment/Interventions  ADLs/Self Care Home Management;Aquatic Therapy;Cryotherapy;Electrical Stimulation;Moist Heat;Traction;Ultrasound;Functional mobility training;Therapeutic activities;Therapeutic exercise;Neuromuscular re-education;Patient/family education;Manual techniques;Passive range of motion;Dry needling;Energy conservation;Taping;Spinal Manipulations;Joint Manipulations    PT Next Visit Plan  continue dry needling and manual techniques for soft tissue restrictions, cervical and thoracic CPAs for joint mobility, postural strength and scap stab work    PT Home Exercise Plan  eval: upper trap stretch, levator scap stretch    Consulted and Agree with Plan of Care  Patient       Patient will benefit from skilled therapeutic intervention in order to improve the following deficits and impairments:  Decreased activity tolerance, Decreased range of motion, Decreased strength, Hypomobility, Increased fascial restricitons, Increased muscle spasms, Impaired flexibility, Postural dysfunction, Pain, Impaired UE functional use  Visit Diagnosis: Cervicalgia  Acute pain of right shoulder  Other muscle spasm  Other symptoms and signs involving the  musculoskeletal system     Problem List Patient Active Problem List   Diagnosis Date Noted  . Special screening for malignant neoplasms, colon         Jenny Howard PT, DPT  [facility] [address] [place], [place], [address] Phone: [phone]   Fax:  [phone]  Name: Jenny Howard MRN: [id number] Date of Birth: 1966/06/13

## 2018-07-08 NOTE — Patient Instructions (Signed)

## 2018-07-09 ENCOUNTER — Telehealth (HOSPITAL_COMMUNITY): Payer: Self-pay

## 2018-07-09 NOTE — Telephone Encounter (Signed)
PT spoke with pt at the end of her session yesterday about having another appointment today or tomorrow. 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 She said she would call the clinic after 5 yesterday. This PT called pt to see if she was coming at 11:15, however, no answer so left a voicemail for her to call the front office if she could come in or not.    Geraldine Solar PT, DPT

## 2018-07-17 ENCOUNTER — Encounter (HOSPITAL_COMMUNITY): Payer: Self-pay

## 2018-07-17 ENCOUNTER — Encounter

## 2018-07-17 ENCOUNTER — Encounter (HOSPITAL_COMMUNITY): Payer: 59

## 2018-07-17 NOTE — Pre-Procedure Instructions (Signed)
Jenny Howard  07/17/2018      [facility], [place] - [address] [place] [address] Phone: [phone] Fax: [phone]    Your procedure is scheduled on 07/22/2018.  Report to [facility] Admitting at 1235 P.M.  Call this number if you have problems the morning of surgery:  [phone]   Remember:  Do not eat or drink after midnight.  You may drink clear liquids until 1135 .  Clear liquids allowed IOM:BTDHR, Juice (non-citric and without pulp), Carbonated beverages, Clear Tea, Black Coffee only and Gatorade    Take these medicines the morning of surgery with A SIP OF WATER: Acetaminophen (Tylenol) - if needed    Do not wear jewelry, make-up or nail polish.  Do not wear lotions, powders, or perfumes, or deodorant.  Do not shave 48 hours prior to surgery.    Do not bring valuables to the hospital.  [facility] is not responsible for any belongings or valuables.  Contacts, Eyeglasses, Hearing aids, dentures or bridgework may not be worn into surgery.  Leave your suitcase in the car.  After surgery it may be brought to your room.  For patients admitted to the hospital, discharge time will be determined by your treatment team.  Patients discharged the day of surgery will not be allowed to drive home.   Name and phone number of your driver:    Special instructions:   [facility]- Preparing For Surgery  Before surgery, you can play an important role. Because skin is not sterile, your skin needs to be as free of germs as possible. You can reduce the number of germs on your skin by washing with CHG (chlorahexidine gluconate) Soap before surgery.  CHG is an antiseptic cleaner which kills germs and bonds with the skin to continue killing germs even after washing.    Oral Hygiene is also important to reduce your risk of infection.  Remember - BRUSH YOUR TEETH THE MORNING OF SURGERY WITH YOUR REGULAR TOOTHPASTE  Please do  not use if you have an allergy to CHG or antibacterial soaps. If your skin becomes reddened/irritated stop using the CHG.  Do not shave (including legs and underarms) for at least 48 hours prior to first CHG shower. It is OK to shave your face.  Please follow these instructions carefully.   1. Shower the NIGHT BEFORE SURGERY and the MORNING OF SURGERY with CHG.   2. If you chose to wash your hair, wash your hair first as usual with your normal shampoo.  3. After you shampoo, rinse your hair and body thoroughly to remove the shampoo.  4. Use CHG as you would any other liquid soap. You can apply CHG directly to the skin and wash gently with a scrungie or a clean washcloth.   5. Apply the CHG Soap to your body ONLY FROM THE NECK DOWN.  Do not use on open wounds or open sores. Avoid contact with your eyes, ears, mouth and genitals (private parts). Wash Face and genitals (private parts)  with your normal soap.  6. Wash thoroughly, paying special attention to the area where your surgery will be performed.  7. Thoroughly rinse your body with warm water from the neck down.  8. DO NOT shower/wash with your normal soap after using and rinsing off the CHG Soap.  9. Pat yourself dry with a CLEAN TOWEL.  10. Wear CLEAN PAJAMAS to bed the night before  surgery, wear comfortable clothes the morning of surgery  11. Place CLEAN SHEETS on your bed the night of your first shower and DO NOT SLEEP WITH PETS.    Day of Surgery: Shower as stated above. Do not apply any deodorants/lotions.  Please wear clean clothes to the hospital/surgery center.   Remember to brush your teeth WITH YOUR REGULAR TOOTHPASTE.    Please read over the following fact sheets that you were given. Pain Booklet, Coughing and Deep Breathing and Surgical Site Infection Prevention

## 2018-07-18 ENCOUNTER — Other Ambulatory Visit: Payer: Self-pay

## 2018-07-18 ENCOUNTER — Ambulatory Visit (HOSPITAL_COMMUNITY): Payer: 59

## 2018-07-18 ENCOUNTER — Encounter (HOSPITAL_COMMUNITY)
Admission: RE | Admit: 2018-07-18 | Discharge: 2018-07-18 | Disposition: A | Discharge status: Home / Self Care | Payer: 59 | Source: Ambulatory Visit | Attending: General Surgery | Admitting: General Surgery

## 2018-07-18 ENCOUNTER — Telehealth (HOSPITAL_COMMUNITY): Payer: Self-pay | Admitting: Family Medicine

## 2018-07-18 DIAGNOSIS — Z01812 Encounter for preprocedural laboratory examination: Secondary | ICD-10-CM | POA: Insufficient documentation

## 2018-07-18 LAB — CBC
HEMATOCRIT: 41.6 % (ref 36.0–46.0)
HEMOGLOBIN: 13.1 g/dL (ref 12.0–15.0)
MCH: 30 pg (ref 26.0–34.0)
MCHC: 31.5 g/dL (ref 30.0–36.0)
MCV: 95.2 fL (ref 80.0–100.0)
NRBC: 0 % (ref 0.0–0.2)
Platelets: 219 10*3/uL (ref 150–400)
RBC: 4.37 MIL/uL (ref 3.87–5.11)
RDW: 11.9 % (ref 11.5–15.5)
WBC: 5.1 10*3/uL (ref 4.0–10.5)

## 2018-07-18 LAB — BASIC METABOLIC PANEL
ANION GAP: 9 (ref 5–15)
BUN: 14 mg/dL (ref 6–20)
CHLORIDE: 109 mmol/L (ref 98–111)
CO2: 24 mmol/L (ref 22–32)
Calcium: 9.4 mg/dL (ref 8.9–10.3)
Creatinine, Ser: 0.79 mg/dL (ref 0.44–1.00)
GFR calc non Af Amer: >60 mL/min (ref >60)
Glucose, Bld: 94 mg/dL (ref 70–99)
POTASSIUM: 3.9 mmol/L (ref 3.5–5.1)
SODIUM: 142 mmol/L (ref 135–145)

## 2018-07-18 NOTE — Pre-Procedure Instructions (Signed)
DAILY DOE  07/18/2018      [facility], [place] - [address] [place] [address] Phone: [phone] Fax: [phone]    Your procedure is scheduled on  Monday,  07/22/2018.   Report to [facility] Admitting at 1235 P.M.             (posted surgery time 2:35p - 4:05p)   Call this number if you have problems the morning of surgery:  [phone]   Remember:   Do not eat any foods after midnight, Sunday.               However:  You may drink clear liquids until 1135 .  Clear liquids allowed VQM:GQQPY, Juice (non-citric and without pulp), Carbonated beverages, Clear Tea, Black Coffee only and Gatorade    Take these medicines the morning of surgery with A SIP OF WATER: Acetaminophen (Tylenol) - if needed    Do not wear jewelry, make-up or nail polish.  Do not wear lotions, powders,  perfumes, or deodorant.  Do not shave 48 hours prior to surgery.    Do not bring valuables to the hospital.  [facility] is not responsible for any belongings or valuables.  Contacts, Eyeglasses, Hearing aids, dentures or bridgework may not be worn into surgery.  Leave your suitcase in the car.  After surgery it may be brought to your room.  For patients admitted to the hospital, discharge time will be determined by your treatment team.  Patients discharged the day of surgery will not be allowed to drive home, and will need someone to stay with you for the first 24 hrs.       Special instructions:   [facility]- Preparing For Surgery  Before surgery, you can play an important role. Because skin is not sterile, your skin needs to be as free of germs as possible. You can reduce the number of germs on your skin by washing with CHG (chlorahexidine gluconate) Soap before surgery.  CHG is an antiseptic cleaner which kills germs and bonds with the skin to continue killing germs even after washing.    Oral Hygiene is also important to  reduce your risk of infection.    Remember - BRUSH YOUR TEETH THE MORNING OF SURGERY WITH YOUR REGULAR TOOTHPASTE  Please do not use if you have an allergy to CHG or antibacterial soaps. If your skin becomes reddened/irritated stop using the CHG.  Do not shave (including legs and underarms) for at least 48 hours prior to first CHG shower. It is OK to shave your face.  Please follow these instructions carefully.   1. Shower the NIGHT BEFORE SURGERY and the MORNING OF SURGERY with CHG.   2. If you chose to wash your hair, wash your hair first as usual with your normal shampoo.  3. After you shampoo, rinse your hair and body thoroughly to remove the shampoo.  4. Use CHG as you would any other liquid soap. You can apply CHG directly to the skin and wash gently with a scrungie or a clean washcloth.   5. Apply the CHG Soap to your body ONLY FROM THE NECK DOWN.  Do not use on open wounds or open sores. Avoid contact with your eyes, ears, mouth and genitals (private parts). Wash Face and genitals (private parts)  with your normal soap.  6. Wash thoroughly, paying special attention to the area where your surgery will be performed.  7. Thoroughly rinse your body with warm water from the neck down.  8. DO NOT shower/wash with your normal soap after using and rinsing off the CHG Soap.  9. Pat yourself dry with a CLEAN TOWEL.  10. Wear CLEAN PAJAMAS to bed the night before surgery, wear comfortable clothes the morning of surgery  11. Place CLEAN SHEETS on your bed the night of your first shower and DO NOT SLEEP WITH PETS.  Day of Surgery: Shower as stated above. Do not apply any deodorants/lotions.  Please wear clean clothes to the hospital/surgery center.   Remember to brush your teeth WITH YOUR REGULAR TOOTHPASTE.  Please read over the following fact sheets that you were given.

## 2018-07-18 NOTE — Progress Notes (Signed)
PCP is Dr. Sharilyn Sites  LOV 03/2018 per the patient. Denies mummur, cp, sob Does have IUD - [employer]

## 2018-07-18 NOTE — Telephone Encounter (Signed)
07/18/18  pt left a message that she was having pre-op today for surgery on Monday.  She needed to cx this appt and will call back at the end of next week to talk about continuing therapy

## 2018-07-19 ENCOUNTER — Encounter

## 2018-07-22 ENCOUNTER — Ambulatory Visit (HOSPITAL_COMMUNITY): Payer: 59 | Admitting: Certified Registered Nurse Anesthetist

## 2018-07-22 ENCOUNTER — Encounter (HOSPITAL_COMMUNITY): Payer: Self-pay | Admitting: *Deleted

## 2018-07-22 ENCOUNTER — Encounter (HOSPITAL_COMMUNITY)
Admission: RE | Disposition: A | Discharge status: Home / Self Care | Payer: Self-pay | Source: Home / Self Care | Attending: General Surgery

## 2018-07-22 ENCOUNTER — Ambulatory Visit (HOSPITAL_COMMUNITY)
Admission: RE | Admit: 2018-07-22 | Discharge: 2018-07-22 | Disposition: A | Discharge status: Home / Self Care | Payer: 59 | Attending: General Surgery | Admitting: General Surgery

## 2018-07-22 DIAGNOSIS — Z79899 Other long term (current) drug therapy: Secondary | ICD-10-CM | POA: Insufficient documentation

## 2018-07-22 DIAGNOSIS — Z792 Long term (current) use of antibiotics: Secondary | ICD-10-CM | POA: Diagnosis not present

## 2018-07-22 DIAGNOSIS — D171 Benign lipomatous neoplasm of skin and subcutaneous tissue of trunk: Secondary | ICD-10-CM | POA: Diagnosis not present

## 2018-07-22 DIAGNOSIS — Z791 Long term (current) use of non-steroidal anti-inflammatories (NSAID): Secondary | ICD-10-CM | POA: Insufficient documentation

## 2018-07-22 HISTORY — PX: MASS EXCISION: SHX2000

## 2018-07-22 LAB — POCT PREGNANCY, URINE: Preg Test, Ur: NEGATIVE

## 2018-07-22 SURGERY — EXCISION MASS
Anesthesia: General | Site: Chest | Laterality: Left

## 2018-07-22 MED ORDER — DEXAMETHASONE SODIUM PHOSPHATE 10 MG/ML IJ SOLN
INTRAMUSCULAR | Status: AC
  Filled 2018-07-22: qty 1

## 2018-07-22 MED ORDER — ONDANSETRON HCL 4 MG/2ML IJ SOLN
INTRAMUSCULAR | Status: DC | PRN
  Administered 2018-07-22: 4 mg via INTRAVENOUS

## 2018-07-22 MED ORDER — MIDAZOLAM HCL 5 MG/5ML IJ SOLN
INTRAMUSCULAR | Status: DC | PRN
  Administered 2018-07-22: 2 mg via INTRAVENOUS

## 2018-07-22 MED ORDER — CEFAZOLIN SODIUM-DEXTROSE 2-4 GM/100ML-% IV SOLN
2.0000 g | INTRAVENOUS | Status: AC
  Administered 2018-07-22: 2 g via INTRAVENOUS
  Filled 2018-07-22: qty 100

## 2018-07-22 MED ORDER — TRAMADOL HCL 50 MG PO TABS
ORAL_TABLET | ORAL | Status: AC
  Filled 2018-07-22: qty 1

## 2018-07-22 MED ORDER — LIDOCAINE 2% (20 MG/ML) 5 ML SYRINGE
INTRAMUSCULAR | Status: DC | PRN
  Administered 2018-07-22: 50 mg via INTRAVENOUS

## 2018-07-22 MED ORDER — ONDANSETRON HCL 4 MG/2ML IJ SOLN
INTRAMUSCULAR | Status: AC
  Filled 2018-07-22: qty 2

## 2018-07-22 MED ORDER — ACETAMINOPHEN 325 MG PO TABS: 325.0000 mg | ORAL_TABLET | ORAL | Status: DC | PRN

## 2018-07-22 MED ORDER — FENTANYL CITRATE (PF) 250 MCG/5ML IJ SOLN
INTRAMUSCULAR | Status: AC
  Filled 2018-07-22: qty 5

## 2018-07-22 MED ORDER — OXYCODONE HCL 5 MG/5ML PO SOLN: 5.0000 mg | Freq: Once | ORAL | Status: DC | PRN

## 2018-07-22 MED ORDER — ACETAMINOPHEN 500 MG PO TABS: 1000.0000 mg | ORAL_TABLET | ORAL | Status: DC

## 2018-07-22 MED ORDER — MIDAZOLAM HCL 2 MG/2ML IJ SOLN
INTRAMUSCULAR | Status: AC
  Filled 2018-07-22: qty 2

## 2018-07-22 MED ORDER — MEPERIDINE HCL 50 MG/ML IJ SOLN: 6.2500 mg | INTRAMUSCULAR | Status: DC | PRN

## 2018-07-22 MED ORDER — FENTANYL CITRATE (PF) 100 MCG/2ML IJ SOLN
INTRAMUSCULAR | Status: AC
  Filled 2018-07-22: qty 2

## 2018-07-22 MED ORDER — TRAMADOL HCL 50 MG PO TABS
50.0000 mg | ORAL_TABLET | Freq: Once | ORAL | Status: AC
  Administered 2018-07-22: 50 mg via ORAL

## 2018-07-22 MED ORDER — BUPIVACAINE-EPINEPHRINE (PF) 0.25% -1:200000 IJ SOLN
INTRAMUSCULAR | Status: AC
  Filled 2018-07-22: qty 30

## 2018-07-22 MED ORDER — GABAPENTIN 100 MG PO CAPS
100.0000 mg | ORAL_CAPSULE | ORAL | Status: AC
  Administered 2018-07-22: 100 mg via ORAL
  Filled 2018-07-22: qty 1

## 2018-07-22 MED ORDER — FENTANYL CITRATE (PF) 100 MCG/2ML IJ SOLN
INTRAMUSCULAR | Status: DC | PRN
  Administered 2018-07-22: 50 ug via INTRAVENOUS
  Administered 2018-07-22: 25 ug via INTRAVENOUS

## 2018-07-22 MED ORDER — LIDOCAINE 2% (20 MG/ML) 5 ML SYRINGE
INTRAMUSCULAR | Status: AC
  Filled 2018-07-22: qty 5

## 2018-07-22 MED ORDER — ONDANSETRON HCL 4 MG/2ML IJ SOLN
4.0000 mg | Freq: Once | INTRAMUSCULAR | Status: AC | PRN
  Administered 2018-07-22: 4 mg via INTRAVENOUS

## 2018-07-22 MED ORDER — BUPIVACAINE-EPINEPHRINE 0.25% -1:200000 IJ SOLN
INTRAMUSCULAR | Status: DC | PRN
  Administered 2018-07-22: 4 mL

## 2018-07-22 MED ORDER — LACTATED RINGERS IV SOLN
INTRAVENOUS | Status: DC
  Administered 2018-07-22: 13:00:00 via INTRAVENOUS

## 2018-07-22 MED ORDER — OXYCODONE HCL 5 MG PO TABS: 5.0000 mg | ORAL_TABLET | Freq: Once | ORAL | Status: DC | PRN

## 2018-07-22 MED ORDER — PHENYLEPHRINE 40 MCG/ML (10ML) SYRINGE FOR IV PUSH (FOR BLOOD PRESSURE SUPPORT)
PREFILLED_SYRINGE | INTRAVENOUS | Status: DC | PRN
  Administered 2018-07-22 (×2): 80 ug via INTRAVENOUS
  Administered 2018-07-22 (×2): 40 ug via INTRAVENOUS

## 2018-07-22 MED ORDER — 0.9 % SODIUM CHLORIDE (POUR BTL) OPTIME
TOPICAL | Status: DC | PRN
  Administered 2018-07-22: 1000 mL

## 2018-07-22 MED ORDER — ACETAMINOPHEN 160 MG/5ML PO SOLN: 325.0000 mg | ORAL | Status: DC | PRN

## 2018-07-22 MED ORDER — PROPOFOL 10 MG/ML IV BOLUS
INTRAVENOUS | Status: AC
  Filled 2018-07-22: qty 20

## 2018-07-22 MED ORDER — TRAMADOL HCL 50 MG PO TABS: 100.0000 mg | ORAL_TABLET | Freq: Four times a day (QID) | ORAL | 0 refills | Status: DC | PRN

## 2018-07-22 MED ORDER — PROPOFOL 10 MG/ML IV BOLUS
INTRAVENOUS | Status: DC | PRN
  Administered 2018-07-22: 150 mg via INTRAVENOUS
  Administered 2018-07-22: 30 mg via INTRAVENOUS
  Administered 2018-07-22: 20 mg via INTRAVENOUS

## 2018-07-22 MED ORDER — CELECOXIB 200 MG PO CAPS
200.0000 mg | ORAL_CAPSULE | ORAL | Status: AC
  Administered 2018-07-22: 200 mg via ORAL
  Filled 2018-07-22: qty 1

## 2018-07-22 MED ORDER — FENTANYL CITRATE (PF) 100 MCG/2ML IJ SOLN
25.0000 ug | INTRAMUSCULAR | Status: DC | PRN
  Administered 2018-07-22 (×3): 50 ug via INTRAVENOUS

## 2018-07-22 SURGICAL SUPPLY — 49 items
ADH SKN CLS APL DERMABOND .7 (GAUZE/BANDAGES/DRESSINGS) ×1
ADH SKN CLS LQ APL DERMABOND (GAUZE/BANDAGES/DRESSINGS) ×1
APPLIER CLIP 9.375 MED OPEN (MISCELLANEOUS) ×6
APR CLP MED 9.3 20 MLT OPN (MISCELLANEOUS) ×2
BLADE SURG 15 STRL LF DISP TIS (BLADE) IMPLANT
BLADE SURG 15 STRL SS (BLADE) ×3
CHLORAPREP W/TINT 26ML (MISCELLANEOUS) ×3 IMPLANT
CLIP APPLIE 9.375 MED OPEN (MISCELLANEOUS) IMPLANT
CLOSURE WOUND 1/2 X4 (GAUZE/BANDAGES/DRESSINGS) ×1
COVER SURGICAL LIGHT HANDLE (MISCELLANEOUS) ×3 IMPLANT
COVER WAND RF STERILE (DRAPES) ×1 IMPLANT
DECANTER SPIKE VIAL GLASS SM (MISCELLANEOUS) ×3 IMPLANT
DERMABOND ADHESIVE PROPEN (GAUZE/BANDAGES/DRESSINGS) ×2
DERMABOND ADVANCED (GAUZE/BANDAGES/DRESSINGS) ×2
DERMABOND ADVANCED .7 DNX12 (GAUZE/BANDAGES/DRESSINGS) ×1 IMPLANT
DERMABOND ADVANCED .7 DNX6 (GAUZE/BANDAGES/DRESSINGS) IMPLANT
DRAPE LAPAROTOMY 100X72 PEDS (DRAPES) ×3 IMPLANT
DRAPE UTILITY XL STRL (DRAPES) ×2 IMPLANT
ELECT CAUTERY BLADE 6.4 (BLADE) ×3 IMPLANT
ELECT REM PT RETURN 9FT ADLT (ELECTROSURGICAL) ×3
ELECTRODE REM PT RTRN 9FT ADLT (ELECTROSURGICAL) ×1 IMPLANT
GLOVE BIO SURGEON STRL SZ7 (GLOVE) ×7 IMPLANT
GLOVE BIOGEL PI IND STRL 7.0 (GLOVE) IMPLANT
GLOVE BIOGEL PI IND STRL 7.5 (GLOVE) ×1 IMPLANT
GLOVE BIOGEL PI INDICATOR 7.0 (GLOVE) ×4
GLOVE BIOGEL PI INDICATOR 7.5 (GLOVE) ×2
GOWN STRL REUS W/ TWL LRG LVL3 (GOWN DISPOSABLE) ×2 IMPLANT
GOWN STRL REUS W/TWL LRG LVL3 (GOWN DISPOSABLE) ×6
KIT BASIN OR (CUSTOM PROCEDURE TRAY) ×3 IMPLANT
KIT TURNOVER KIT B (KITS) ×3 IMPLANT
NDL HYPO 25GX1X1/2 BEV (NEEDLE) ×1 IMPLANT
NEEDLE HYPO 25GX1X1/2 BEV (NEEDLE) ×3 IMPLANT
NS IRRIG 1000ML POUR BTL (IV SOLUTION) ×3 IMPLANT
PACK SURGICAL SETUP 50X90 (CUSTOM PROCEDURE TRAY) ×3 IMPLANT
PAD ARMBOARD 7.5X6 YLW CONV (MISCELLANEOUS) ×3 IMPLANT
PENCIL BUTTON HOLSTER BLD 10FT (ELECTRODE) ×3 IMPLANT
SPECIMEN JAR SMALL (MISCELLANEOUS) ×2 IMPLANT
SPONGE LAP 18X18 RF (DISPOSABLE) ×2 IMPLANT
STAPLER VISISTAT 35W (STAPLE) ×3 IMPLANT
STRIP CLOSURE SKIN 1/2X4 (GAUZE/BANDAGES/DRESSINGS) ×1 IMPLANT
SUT MNCRL AB 4-0 PS2 18 (SUTURE) ×3 IMPLANT
SUT VIC AB 2-0 SH 27 (SUTURE) ×3
SUT VIC AB 2-0 SH 27X BRD (SUTURE) ×1 IMPLANT
SUT VIC AB 3-0 SH 27 (SUTURE) ×3
SUT VIC AB 3-0 SH 27XBRD (SUTURE) ×1 IMPLANT
SYR CONTROL 10ML LL (SYRINGE) ×3 IMPLANT
TUBE CONNECTING 12'X1/4 (SUCTIONS) ×1
TUBE CONNECTING 12X1/4 (SUCTIONS) ×1 IMPLANT
YANKAUER SUCT BULB TIP NO VENT (SUCTIONS) ×2 IMPLANT

## 2018-07-22 NOTE — Progress Notes (Signed)
Urine HCG Negative

## 2018-07-22 NOTE — Op Note (Signed)
Preoperative diagnosis: submuscular left chest lipoma Postoperative diagnosis: same as above Procedure: Excision of 8x4 cm left chest wall lipoma, submuscular Surgeon: Dr Serita Grammes Anesthesia: General Estimated blood loss: Minimal Complications: None Drains: None Specimens: Left chest wall mass to pathology Sponge and count was correct at completion Disposition to recovery stable condition  Indications: This a 52 year old female who has a large left chest wall mass that is in the infraclavicular position.  She has an ultrasound that showed a small mass from 3 years ago but clinically this area is gotten much bigger.  She has a CT scan that I have reviewed that does not mention the mass but I have viewed it and it appears to be a benign lipoma that is not adherent to any structures.  We discussed excision due to her symptoms and enlarging.  Procedure: After informed consent was obtained the patient was taken to the operating room.  She was given antibiotics and SCDs were in place.  She was placed under general anesthesia without complication.  Her left chest was prepped and draped in the standard sterile surgical fashion.  A surgical timeout was then performed.  I infiltrated Marcaine over the left chest just below the clavicle.  I then made an incision.  I carried this down to the level of the pectoral fascia.  I then divided the pectoralis fascia.  The lump was below this.  I then split the pectoralis muscle below the clavicle.  Then I noted there to be the what appeared to be clinically be a lipoma.  I was able to get in this cavity.  With some difficulty I was able to dissect this mass free from the surrounding structures.  The base of it was on top of the subclavian.  I was able to dissect this free and remove the mass in its entirety.  Hemostasis was observed.  I then sewed the muscle and fascia back together with 2-0 Vicryl.  The skin was closed with 3-0 Vicryl and 4-0 Monocryl.  Glue and  Steri-Strips were placed.  She tolerated this well was extubated and transferred to recovery stable.

## 2018-07-22 NOTE — Anesthesia Procedure Notes (Signed)
Procedure Name: LMA Insertion °Performed by: Hevin Jeffcoat H, CRNA °Pre-anesthesia Checklist: Patient identified, Emergency Drugs available, Suction available and Patient being monitored °Patient Re-evaluated:Patient Re-evaluated prior to induction °Oxygen Delivery Method: Circle System Utilized °Preoxygenation: Pre-oxygenation with 100% oxygen °Induction Type: IV induction °Ventilation: Mask ventilation without difficulty °LMA: LMA inserted °LMA Size: 4.0 °Number of attempts: 1 °Airway Equipment and Method: Bite block °Placement Confirmation: positive ETCO2 °Tube secured with: Tape °Dental Injury: Teeth and Oropharynx as per pre-operative assessment  ° ° ° ° ° ° °

## 2018-07-22 NOTE — Discharge Instructions (Signed)
[facility] ° POST OP INSTRUCTIONS ° °Always review your discharge instruction sheet given to you by the facility where your surgery was performed. °IF YOU HAVE DISABILITY OR FAMILY LEAVE FORMS, YOU MUST BRING THEM TO THE OFFICE FOR PROCESSING.   °DO NOT GIVE THEM TO YOUR DOCTOR. ° °1. A  prescription for pain medication may be given to you upon discharge.  Take your pain medication as prescribed, if needed.  If narcotic pain medicine is not needed, then you may take acetaminophen (Tylenol), naprosyn (Alleve) or ibuprofen (Advil) as needed. °2. Take your usually prescribed medications unless otherwise directed. °3. If you need a refill on your pain medication, please contact your pharmacy.  They will contact our office to request authorization. Prescriptions will not be filled after 5 pm or on week-ends. °4. You should follow a light diet the first 24 hours after arrival home, such as soup and crackers, etc.  Be sure to include lots of fluids daily.  Resume your normal diet the day after surgery. °5. Most patients will experience some swelling and bruising around the umbilicus or in the groin and scrotum.  Ice packs and reclining will help.  Swelling and bruising can take several days to resolve.  °6. It is common to experience some constipation if taking pain medication after surgery.  Increasing fluid intake and taking a stool softener (such as Colace) will usually help or prevent this problem from occurring.  A mild laxative (Milk of Magnesia or Miralax) should be taken according to package directions if there are no bowel movements after 48 hours. °7. Unless discharge instructions indicate otherwise, you may remove your bandages 48 hours after surgery, and you may shower at that time.  You may have steri-strips (small skin tapes) in place directly over the incision.  These strips should be left on the skin for 7-10 days and will come off on their own.  If your surgeon used skin glue on the  incision, you may shower in 24 hours.  The glue will flake off over the next 2-3 weeks.  Any sutures or staples will be removed at the office during your follow-up visit. °8. ACTIVITIES:  You may resume regular (light) daily activities beginning the next day--such as daily self-care, walking, climbing stairs--gradually increasing activities as tolerated.  You may have sexual intercourse when it is comfortable.  Refrain from any heavy lifting or straining until approved by your doctor. °a. You may drive when you are no longer taking prescription pain medication, you can comfortably wear a seatbelt, and you can safely maneuver your car and apply brakes. °b. RETURN TO WORK:  __________________________________________________________ °9. You should see your doctor in the office for a follow-up appointment approximately 2-3 weeks after your surgery.  Make sure that you call for this appointment within a day or two after you arrive home to insure a convenient appointment time. °10. OTHER INSTRUCTIONS:  __________________________________________________________________________________________________________________________________________________________________________________________  °WHEN TO CALL YOUR DOCTOR: °1. Fever over 101.0 °2. Inability to urinate °3. Nausea and/or vomiting °4. Extreme swelling or bruising °5. Continued bleeding from incision. °6. Increased pain, redness, or drainage from the incision ° °The clinic staff is available to answer your questions during regular business hours.  Please don’t hesitate to call and ask to speak to one of the nurses for clinical concerns.  If you have a medical emergency, go to the nearest emergency room or call 911.  A surgeon from [facility] is always on call at   the hospital ° ° °[address], [address], [place], [place]  [address] ? ° [address] [address], [place], [place]   [address] °[phone] ? [phone] ? FAX [phone] °Web site:  [email or web address] ° ° °

## 2018-07-22 NOTE — Anesthesia Preprocedure Evaluation (Signed)
Anesthesia Evaluation  Patient identified by MRN, date of birth, ID band Patient awake    Reviewed: Allergy & Precautions, H&P , NPO status , Patient's Chart, lab work & pertinent test results, reviewed documented beta blocker date and time   Airway Mallampati: II  TM Distance: >3 FB Neck ROM: full    Dental no notable dental hx.    Pulmonary neg pulmonary ROS,    Pulmonary exam normal breath sounds clear to auscultation       Cardiovascular Exercise Tolerance: Good negative cardio ROS   Rhythm:regular Rate:Normal     Neuro/Psych negative neurological ROS  negative psych ROS   GI/Hepatic negative GI ROS, Neg liver ROS,   Endo/Other  negative endocrine ROS  Renal/GU negative Renal ROS  negative genitourinary   Musculoskeletal   Abdominal   Peds  Hematology negative hematology ROS (+)   Anesthesia Other Findings   Reproductive/Obstetrics negative OB ROS                             Anesthesia Physical Anesthesia Plan  ASA: II  Anesthesia Plan: General   Post-op Pain Management:    Induction: Intravenous  PONV Risk Score and Plan: 3 and Ondansetron, Treatment may vary due to age or medical condition, Dexamethasone and Midazolam  Airway Management Planned: LMA  Additional Equipment:   Intra-op Plan:   Post-operative Plan:   Informed Consent: I have reviewed the patients History and Physical, chart, labs and discussed the procedure including the risks, benefits and alternatives for the proposed anesthesia with the patient or authorized representative who has indicated his/her understanding and acceptance.   Dental Advisory Given  Plan Discussed with: CRNA, Anesthesiologist and Surgeon  Anesthesia Plan Comments: ( )        Anesthesia Quick Evaluation

## 2018-07-22 NOTE — Anesthesia Postprocedure Evaluation (Signed)
Anesthesia Post Note  Patient: OLIVER NEUWIRTH  Procedure(s) Performed: EXCISION OF SUBMUSCULAR LEFT UPPER CHEST MASS (Left Chest)     Patient location during evaluation: PACU Anesthesia Type: General Level of consciousness: awake and alert Pain management: pain level controlled Vital Signs Assessment: post-procedure vital signs reviewed and stable Respiratory status: spontaneous breathing, nonlabored ventilation, respiratory function stable and patient connected to nasal cannula oxygen Cardiovascular status: blood pressure returned to baseline and stable Postop Assessment: no apparent nausea or vomiting Anesthetic complications: no    Last Vitals:  Vitals:   07/22/18 1547 07/22/18 1602  BP: 106/80 108/76  Pulse: 68 66  Resp: 13 14  Temp:    SpO2: 98% 99%    Last Pain:  Vitals:   07/22/18 1530  TempSrc:   PainSc: Asleep                 Serafin Decatur

## 2018-07-22 NOTE — Transfer of Care (Signed)
Immediate Anesthesia Transfer of Care Note  Patient: Jenny Howard  Procedure(s) Performed: EXCISION OF SUBMUSCULAR LEFT UPPER CHEST MASS (Left Chest)  Patient Location: PACU  Anesthesia Type:General  Level of Consciousness: awake, alert  and oriented  Airway & Oxygen Therapy: Patient Spontanous Breathing and Patient connected to nasal cannula oxygen  Post-op Assessment: Report given to RN and Post -op Vital signs reviewed and stable  Post vital signs: Reviewed and stable  Last Vitals:  Vitals Value Taken Time  BP 108/74 07/22/2018  3:02 PM  Temp    Pulse 74 07/22/2018  3:01 PM  Resp 15 07/22/2018  3:02 PM  SpO2 98 % 07/22/2018  3:01 PM  Vitals shown include unvalidated device data.  Last Pain:  Vitals:   07/22/18 1222  TempSrc:   PainSc: 0-No pain         Complications: No apparent anesthesia complications

## 2018-07-22 NOTE — H&P (Signed)
  52 yof referred by Dr Hilma Favors for left chest mass. Jenny Howard noted this two years ago. [place] then showed a 1.4x1.4x0.5 cm mass that appeared to be lipoma. this has increased in size considerably since then. no infection. no drainage. Jenny Howard had a ct scan with a negative report. there is no way this didnt see the mass and I need the images.    Past Surgical History Cesarean Section - 1   Diagnostic Studies History  Colonoscopy  1-5 years ago Mammogram  within last year Pap Smear  1-5 years ago  Allergies  No Known Drug Allergies  Allergies Reconciled   Medication History  Metaxalone (800MG  Tablet, Oral) Active. Meloxicam (15MG  Tablet, Oral) Active. Azithromycin (250MG  Tablet, Oral) Active. diazePAM (2MG  Tablet, Oral) Active. CeleBREX (50MG  Capsule, Oral) Active. Medications Reconciled  Social History  Alcohol use  Occasional alcohol use. Caffeine use  Carbonated beverages. No drug use  Tobacco use  Never smoker.  Family History  Arthritis  Father, Mother. Cerebrovascular Accident  Mother. Hypertension  Father. Ischemic Bowel Disease  Mother. Respiratory Condition  Mother. Thyroid problems  Mother.  Pregnancy / Birth History  Age at menarche  72 years. Contraceptive History  Intrauterine device, Oral contraceptives. Gravida  1 Irregular periods  Maternal age  67-25 Para  1  Other Problems  No pertinent past medical history   Review of Systems  General Not Present- Appetite Loss, Chills, Fatigue, Fever, Night Sweats, Weight Gain and Weight Loss. Skin Not Present- Change in Wart/Mole, Dryness, Hives, Jaundice, New Lesions, Non-Healing Wounds, Rash and Ulcer. HEENT Present- Seasonal Allergies and Wears glasses/contact lenses. Not Present- Earache, Hearing Loss, Hoarseness, Nose Bleed, Oral Ulcers, Ringing in the Ears, Sinus Pain, Sore Throat, Visual Disturbances and Yellow Eyes. Respiratory Not Present- Bloody sputum, Chronic Cough, Difficulty  Breathing, Snoring and Wheezing. Breast Not Present- Breast Mass, Breast Pain, Nipple Discharge and Skin Changes. Cardiovascular Not Present- Chest Pain, Difficulty Breathing Lying Down, Leg Cramps, Palpitations, Rapid Heart Rate, Shortness of Breath and Swelling of Extremities. Gastrointestinal Not Present- Abdominal Pain, Bloating, Bloody Stool, Change in Bowel Habits, Chronic diarrhea, Constipation, Difficulty Swallowing, Excessive gas, Gets full quickly at meals, Hemorrhoids, Indigestion, Nausea, Rectal Pain and Vomiting. Female Genitourinary Not Present- Frequency, Nocturia, Painful Urination, Pelvic Pain and Urgency. Musculoskeletal Not Present- Back Pain, Joint Pain, Joint Stiffness, Muscle Pain, Muscle Weakness and Swelling of Extremities. Neurological Not Present- Decreased Memory, Fainting, Headaches, Numbness, Seizures, Tingling, Tremor, Trouble walking and Weakness. Psychiatric Not Present- Anxiety, Bipolar, Change in Sleep Pattern, Depression, Fearful and Frequent crying. Endocrine Not Present- Cold Intolerance, Excessive Hunger, Hair Changes, Heat Intolerance, Hot flashes and New Diabetes. Hematology Present- Easy Bruising. Not Present- Blood Thinners, Excessive bleeding, Gland problems, HIV and Persistent Infections.  Vitals Weight: 171.6 lb Height: 64in Body Surface Area: 1.83 m Body Mass Index: 29.45 kg/m  Temp.: 98.22F(Temporal)  Pulse: 78 (Regular)  BP: 132/78 (Sitting, Left Arm, Standard) Physical Exam  Breast Note: left upper chest with 8x4 cm soft mobile mass c/w lipoma cv rrr Lungs clear bilaterally  Assessment & Plan  LIPOMA OF TORSO (D17.1) Story: I have reviewed ct scan and examined her. I think this may be below muscle but should be able to excised as it is mobile and consistent with benign lesion. will plan on excision under general. risks include damage to other structures, seroma, infection, possible recurrence. will proceed with surgery before  end of year.

## 2018-07-23 ENCOUNTER — Encounter (HOSPITAL_COMMUNITY): Payer: Self-pay | Admitting: General Surgery

## 2018-07-24 ENCOUNTER — Encounter (HOSPITAL_COMMUNITY): Payer: 59

## 2018-07-26 ENCOUNTER — Encounter (HOSPITAL_COMMUNITY): Payer: 59

## 2018-07-29 ENCOUNTER — Ambulatory Visit (HOSPITAL_COMMUNITY): Payer: 59

## 2018-07-29 ENCOUNTER — Telehealth (HOSPITAL_COMMUNITY): Payer: Self-pay

## 2018-07-29 NOTE — Telephone Encounter (Signed)
Pt will see Surgeon again today due to complications- she wants to be put on hold until things get better

## 2018-08-02 ENCOUNTER — Encounter (HOSPITAL_COMMUNITY): Payer: 59

## 2018-08-12 ENCOUNTER — Other Ambulatory Visit (HOSPITAL_COMMUNITY): Payer: Self-pay | Admitting: General Surgery

## 2018-08-12 ENCOUNTER — Other Ambulatory Visit: Payer: Self-pay | Admitting: General Surgery

## 2018-08-12 DIAGNOSIS — Z9889 Other specified postprocedural states: Secondary | ICD-10-CM

## 2018-08-13 ENCOUNTER — Ambulatory Visit (HOSPITAL_COMMUNITY)
Admission: RE | Admit: 2018-08-13 | Discharge: 2018-08-13 | Disposition: A | Discharge status: Home / Self Care | Payer: 59 | Source: Ambulatory Visit | Attending: General Surgery | Admitting: General Surgery

## 2018-08-13 ENCOUNTER — Other Ambulatory Visit: Payer: Self-pay | Admitting: General Surgery

## 2018-08-13 ENCOUNTER — Other Ambulatory Visit (HOSPITAL_COMMUNITY): Payer: Self-pay | Admitting: General Surgery

## 2018-08-13 DIAGNOSIS — R600 Localized edema: Secondary | ICD-10-CM | POA: Diagnosis not present

## 2018-08-13 DIAGNOSIS — Z9889 Other specified postprocedural states: Secondary | ICD-10-CM | POA: Diagnosis present

## 2018-08-13 DIAGNOSIS — D171 Benign lipomatous neoplasm of skin and subcutaneous tissue of trunk: Secondary | ICD-10-CM

## 2018-08-16 ENCOUNTER — Encounter (HOSPITAL_COMMUNITY): Payer: Self-pay | Admitting: Radiology

## 2018-08-16 ENCOUNTER — Ambulatory Visit (HOSPITAL_COMMUNITY)
Admission: RE | Admit: 2018-08-16 | Discharge: 2018-08-16 | Disposition: A | Discharge status: Home / Self Care | Payer: 59 | Source: Ambulatory Visit | Attending: General Surgery | Admitting: General Surgery

## 2018-08-16 DIAGNOSIS — D171 Benign lipomatous neoplasm of skin and subcutaneous tissue of trunk: Secondary | ICD-10-CM | POA: Diagnosis present

## 2018-08-16 DIAGNOSIS — R221 Localized swelling, mass and lump, neck: Secondary | ICD-10-CM | POA: Diagnosis not present

## 2018-08-16 MED ORDER — IOHEXOL 300 MG/ML  SOLN
75.0000 mL | Freq: Once | INTRAMUSCULAR | Status: AC | PRN
  Administered 2018-08-16: 75 mL via INTRAVENOUS

## 2018-08-21 DIAGNOSIS — G542 Cervical root disorders, not elsewhere classified: Secondary | ICD-10-CM | POA: Diagnosis not present

## 2018-09-06 DIAGNOSIS — M542 Cervicalgia: Secondary | ICD-10-CM | POA: Diagnosis not present

## 2018-09-06 DIAGNOSIS — M47812 Spondylosis without myelopathy or radiculopathy, cervical region: Secondary | ICD-10-CM | POA: Diagnosis not present

## 2018-09-06 DIAGNOSIS — G540 Brachial plexus disorders: Secondary | ICD-10-CM | POA: Diagnosis not present

## 2018-09-06 DIAGNOSIS — M5030 Other cervical disc degeneration, unspecified cervical region: Secondary | ICD-10-CM | POA: Diagnosis not present

## 2018-09-11 DIAGNOSIS — G540 Brachial plexus disorders: Secondary | ICD-10-CM | POA: Diagnosis not present

## 2018-09-13 ENCOUNTER — Encounter (HOSPITAL_COMMUNITY): Payer: Self-pay

## 2018-09-13 NOTE — Therapy (Signed)
[place], [place], [address] Phone: [phone]   Fax:  [phone]  Patient Details  Name: Jenny Howard MRN: [id number] Date of Birth: May 14, 1966 Referring Provider:  No ref. provider found  Encounter Date: 09/13/2018  PHYSICAL THERAPY DISCHARGE SUMMARY  Visits from Start of Care: 2  Current functional level related to goals / functional outcomes: See last note   Remaining deficits: See last note   Education / Equipment: n/a  Plan: Patient agrees to discharge.  Patient goals were not met. Patient is being discharged due to not returning since the last visit.  ?????    Geraldine Solar PT, [place] [address] [place], [place], [address] Phone: [phone]   Fax:  [phone]

## 2018-09-17 ENCOUNTER — Other Ambulatory Visit: Payer: Self-pay | Admitting: Neurosurgery

## 2018-09-17 DIAGNOSIS — M5020 Other cervical disc displacement, unspecified cervical region: Secondary | ICD-10-CM

## 2018-09-20 ENCOUNTER — Ambulatory Visit
Admission: RE | Admit: 2018-09-20 | Discharge: 2018-09-20 | Disposition: A | Discharge status: Home / Self Care | Payer: 59 | Source: Ambulatory Visit | Attending: Neurosurgery | Admitting: Neurosurgery

## 2018-09-20 DIAGNOSIS — M4802 Spinal stenosis, cervical region: Secondary | ICD-10-CM | POA: Diagnosis not present

## 2018-09-20 DIAGNOSIS — M5020 Other cervical disc displacement, unspecified cervical region: Secondary | ICD-10-CM

## 2018-09-23 DIAGNOSIS — G540 Brachial plexus disorders: Secondary | ICD-10-CM | POA: Diagnosis not present

## 2018-09-23 DIAGNOSIS — M5030 Other cervical disc degeneration, unspecified cervical region: Secondary | ICD-10-CM | POA: Diagnosis not present

## 2018-09-23 DIAGNOSIS — M5020 Other cervical disc displacement, unspecified cervical region: Secondary | ICD-10-CM | POA: Diagnosis not present

## 2018-10-31 DIAGNOSIS — G540 Brachial plexus disorders: Secondary | ICD-10-CM | POA: Diagnosis not present

## 2018-10-31 DIAGNOSIS — M5030 Other cervical disc degeneration, unspecified cervical region: Secondary | ICD-10-CM | POA: Diagnosis not present

## 2018-10-31 DIAGNOSIS — M47812 Spondylosis without myelopathy or radiculopathy, cervical region: Secondary | ICD-10-CM | POA: Diagnosis not present

## 2018-11-07 DIAGNOSIS — L570 Actinic keratosis: Secondary | ICD-10-CM | POA: Diagnosis not present

## 2018-11-07 DIAGNOSIS — Z85828 Personal history of other malignant neoplasm of skin: Secondary | ICD-10-CM | POA: Diagnosis not present

## 2018-11-07 DIAGNOSIS — D239 Other benign neoplasm of skin, unspecified: Secondary | ICD-10-CM | POA: Diagnosis not present

## 2018-12-02 ENCOUNTER — Ambulatory Visit: Payer: 59 | Admitting: Obstetrics and Gynecology

## 2019-06-19 ENCOUNTER — Telehealth: Payer: Self-pay | Admitting: Obstetrics and Gynecology

## 2019-06-19 NOTE — Telephone Encounter (Signed)
Spoke with pt. Pt states wanting to check IUD since being placed on 08/09/2017 and not having cycle this month or last. In sept 2020, pt had abd pain with heavy bleeding. Denies only light pink spotting in Oct 2020 cycle. Wore no pads or tampons in Oct.  Wants to know if IUD is still in correct position or going through menopause? OV scheduled 06/23/19 at 9:30am with Dr Quincy Simmonds. Pt doesn't have AEX scheduled d/t not knowing work schedule, but aware will call in Jan 2021 to schedule.  Routing to provider for final review. Patient is agreeable to disposition. Will close encounter.

## 2019-06-19 NOTE — Telephone Encounter (Signed)
Patient states back in September, she had abdominal pain and heavy bleeding with IUD. Wants to schedule IUD check with Dr. Quincy Simmonds to make sure it is still in the correct position. She has not had a cycle this month or last month. States she does have fibroids, but Dr. Sabra Heck said they were not causing any problems.

## 2019-06-23 ENCOUNTER — Other Ambulatory Visit: Payer: Self-pay

## 2019-06-23 ENCOUNTER — Encounter: Payer: Self-pay | Admitting: Obstetrics and Gynecology

## 2019-06-23 ENCOUNTER — Ambulatory Visit: Payer: 59 | Admitting: Obstetrics and Gynecology

## 2019-06-23 ENCOUNTER — Telehealth: Payer: Self-pay | Admitting: Obstetrics and Gynecology

## 2019-06-23 VITALS — BP 108/70 | HR 84 | Temp 97.2°F | Ht 63.75 in | Wt 179.4 lb

## 2019-06-23 DIAGNOSIS — N951 Menopausal and female climacteric states: Secondary | ICD-10-CM

## 2019-06-23 DIAGNOSIS — T8332XA Displacement of intrauterine contraceptive device, initial encounter: Secondary | ICD-10-CM | POA: Diagnosis not present

## 2019-06-23 NOTE — Progress Notes (Signed)
GYNECOLOGY  VISIT   HPI: 53 y.o.   Married  Caucasian  female   G1P1001 with No LMP recorded. (Menstrual status: IUD).   here for IUD check.  Patient states she didn't have menses with IUD after placement. Beginning 9/end/20 began having spotting and cramping.   At the end of October, she had some lower abdominal cramping and spotting.   She has known small fibroids by [place] from 08/02/17.   Occasional increased heat at night.   Had a large lipoma removed.  She is having some left arm pain following surgery. She has seen orthopedic surgeon and a neurosurgeon in follow up.  Gabapentin made her too sleepy.  Motrin and Tylenol dull the pain.   She is a Emergency planning/management officer and cannot work.   GYNECOLOGIC HISTORY: No LMP recorded. (Menstrual status: IUD). Contraception: Mirena IUD 08-09-17 Menopausal hormone therapy:  n/a Last mammogram: 11-06-17 Neg/density C/BiRads2 Last pap smear: 11-02-16 Neg:Neg HR HPV        OB History    Gravida  1   Para  1   Term  1   Preterm      AB      Living  1     SAB      TAB      Ectopic      Multiple      Live Births                 Patient Active Problem List   Diagnosis Date Noted  . Special screening for malignant neoplasms, colon     Past Medical History:  Diagnosis Date  . 'light-for-dates' infant with signs of fetal malnutrition   . 'light-for-dates' infant with signs of fetal malnutrition   . 'light-for-dates' infant with signs of fetal malnutrition   . 'light-for-dates' infant with signs of fetal malnutrition   . 'light-for-dates' infant with signs of fetal malnutrition   . 'light-for-dates' infant with signs of fetal malnutrition   . 'light-for-dates' infant with signs of fetal malnutrition   . 'light-for-dates' infant with signs of fetal malnutrition   . 'light-for-dates' infant with signs of fetal malnutrition   . Medical history non-contributory     Past Surgical History:  Procedure Laterality Date  . CESAREAN  SECTION  1990  . COLONOSCOPY N/A 12/04/2016   Procedure: COLONOSCOPY;  Surgeon: Danie Binder, MD;  [facility];  Service: Endoscopy;  Laterality: N/A;  11:15 AM  . MASS EXCISION Left 07/22/2018   Procedure: EXCISION OF SUBMUSCULAR LEFT UPPER CHEST MASS;  Surgeon: Rolm Bookbinder, MD;  Location: [place];  Service: General;  Laterality: Left;    No current outpatient medications on file.   No current facility-administered medications for this visit.      ALLERGIES: Prednisone  Family History  Problem Relation Age of Onset  . Thyroid disease Mother   . Stroke Mother   . Hypertension Father   . Cancer Maternal Grandmother        Hx Thyroid Ca  . Cancer Maternal Grandfather        unknown origin  . Diabetes Paternal Grandmother   . Hyperlipidemia Paternal Grandfather   . Thyroid disease Maternal Aunt   . Colon cancer Cousin   . Breast cancer Maternal Aunt   . Thyroid disease Maternal Aunt     Social History   Socioeconomic History  . Marital status: Married    Spouse name: Not on file  . Number of children: Not on file  .  Years of education: Not on file  . Highest education level: Not on file  Occupational History  . Not on file  Social Needs  . Financial resource strain: Not on file  . Food insecurity    Worry: Not on file    Inability: Not on file  . Transportation needs    Medical: Not on file    Non-medical: Not on file  Tobacco Use  . Smoking status: Never Smoker  . Smokeless tobacco: Never Used  Substance and Sexual Activity  . Alcohol use: Yes    Comment: 1 drink per month  . Drug use: No  . Sexual activity: Yes    Partners: Male    Birth control/protection: I.U.D.    Comment: mirena IUD placed 08/09/17  Lifestyle  . Physical activity    Days per week: Not on file    Minutes per session: Not on file  . Stress: Not on file  Relationships  . Social Herbalist on phone: Not on file    Gets together: Not on file    Attends  religious service: Not on file    Active member of club or organization: Not on file    Attends meetings of clubs or organizations: Not on file    Relationship status: Not on file  . Intimate partner violence    Fear of current or ex partner: Not on file    Emotionally abused: Not on file    Physically abused: Not on file    Forced sexual activity: Not on file  Other Topics Concern  . Not on file  Social History Narrative  . Not on file    Review of Systems  All other systems reviewed and are negative.   PHYSICAL EXAMINATION:    BP 108/70   Pulse 84   Temp (!) 97.2 F (36.2 C) (Temporal)   Ht 5' 3.75" (1.619 m)   Wt 179 lb 6.4 oz (81.4 kg)   BMI 31.04 kg/m     General appearance: alert, cooperative and appears stated age   Pelvic: External genitalia:  no lesions              Urethra:  normal appearing urethra with no masses, tenderness or lesions              Bartholins and Skenes: normal                 Vagina: normal appearing vagina with normal color and discharge, no lesions              Cervix: no lesions.  No IUD strings seen.                Bimanual Exam:  Uterus:  normal size, contour, position, consistency, mobility, non-tender              Adnexa: no mass, fullness, tenderness               Chaperone was present for exam.  ASSESSMENT  Absent IUD strings.  Mirena IUD patient.  Hx abnormal uterine bleeding.  Some menopausal symptoms.   PLAN  Mammogram recommended.  She will schedule an annual exam for next year.  Return for pelvic US.  Check [facility].    An After Visit Summary was printed and given to the patient.  __15____ minutes face to face time of which over 50% was spent in counseling.

## 2019-06-23 NOTE — Telephone Encounter (Signed)
Call placed to patient to review benefit for recommended ultrasound. Left voicemail message requesting a return call

## 2019-06-24 LAB — FOLLICLE STIMULATING HORMONE: FSH: 13.9 m[IU]/mL

## 2019-06-24 NOTE — Telephone Encounter (Signed)
Second call placed to patient to review benefit and scheduled recommended ultrasound. Left voicemail message requesting a return call

## 2019-06-26 ENCOUNTER — Encounter: Payer: Self-pay | Admitting: Obstetrics and Gynecology

## 2019-06-26 ENCOUNTER — Ambulatory Visit (INDEPENDENT_AMBULATORY_CARE_PROVIDER_SITE_OTHER): Payer: 59

## 2019-06-26 ENCOUNTER — Ambulatory Visit: Payer: 59 | Admitting: Obstetrics and Gynecology

## 2019-06-26 ENCOUNTER — Other Ambulatory Visit: Payer: Self-pay

## 2019-06-26 VITALS — BP 108/66 | HR 76 | Temp 97.5°F | Ht 63.75 in | Wt 183.4 lb

## 2019-06-26 DIAGNOSIS — N83202 Unspecified ovarian cyst, left side: Secondary | ICD-10-CM

## 2019-06-26 DIAGNOSIS — T8332XA Displacement of intrauterine contraceptive device, initial encounter: Secondary | ICD-10-CM | POA: Diagnosis not present

## 2019-06-26 DIAGNOSIS — T8332XD Displacement of intrauterine contraceptive device, subsequent encounter: Secondary | ICD-10-CM | POA: Diagnosis not present

## 2019-06-26 DIAGNOSIS — D219 Benign neoplasm of connective and other soft tissue, unspecified: Secondary | ICD-10-CM | POA: Diagnosis not present

## 2019-06-26 NOTE — Progress Notes (Signed)
GYNECOLOGY  VISIT   HPI: 53 y.o.   Married  Caucasian  female   G1P1001 with No LMP recorded. (Menstrual status: IUD).   here for   IUD check up due to lost strings on exam on 06/23/19.   She has not had menses after the IUD was placed. In September, had a normal period and October had spotting.   She has known fibroids.   She reports right hip pain, which resolved after her IUD was placed until last night.   [place] 13.9 on 06/23/19.  Occasional hot flash at night.   GYNECOLOGIC HISTORY: No LMP recorded. (Menstrual status: IUD). Contraception:   Mirena 08/09/17 Menopausal hormone therapy:  NA Last mammogram:  11/06/17 - Bi-RADS2, cat C density Last pap smear:   11-02-16 Neg:Neg HR HPV        OB History    Gravida  1   Para  1   Term  1   Preterm      AB      Living  1     SAB      TAB      Ectopic      Multiple      Live Births                 Patient Active Problem List   Diagnosis Date Noted  . Special screening for malignant neoplasms, colon     Past Medical History:  Diagnosis Date  . 'light-for-dates' infant with signs of fetal malnutrition   . 'light-for-dates' infant with signs of fetal malnutrition   . 'light-for-dates' infant with signs of fetal malnutrition   . 'light-for-dates' infant with signs of fetal malnutrition   . 'light-for-dates' infant with signs of fetal malnutrition   . 'light-for-dates' infant with signs of fetal malnutrition   . 'light-for-dates' infant with signs of fetal malnutrition   . 'light-for-dates' infant with signs of fetal malnutrition   . 'light-for-dates' infant with signs of fetal malnutrition   . Medical history non-contributory     Past Surgical History:  Procedure Laterality Date  . CESAREAN SECTION  1990  . COLONOSCOPY N/A 12/04/2016   Procedure: COLONOSCOPY;  Surgeon: Danie Binder, MD;  [facility];  Service: Endoscopy;  Laterality: N/A;  11:15 AM  . MASS EXCISION Left 07/22/2018   Procedure: EXCISION OF SUBMUSCULAR LEFT UPPER CHEST MASS;  Surgeon: Rolm Bookbinder, MD;  Location: [place];  Service: General;  Laterality: Left;    No current outpatient medications on file.   No current facility-administered medications for this visit.      ALLERGIES: Prednisone  Family History  Problem Relation Age of Onset  . Thyroid disease Mother   . Stroke Mother   . Hypertension Father   . Cancer Maternal Grandmother        Hx Thyroid Ca  . Cancer Maternal Grandfather        unknown origin  . Diabetes Paternal Grandmother   . Hyperlipidemia Paternal Grandfather   . Thyroid disease Maternal Aunt   . Colon cancer Cousin   . Breast cancer Maternal Aunt   . Thyroid disease Maternal Aunt     Social History   Socioeconomic History  . Marital status: Married    Spouse name: Not on file  . Number of children: Not on file  . Years of education: Not on file  . Highest education level: Not on file  Occupational History  . Not on file  Social Needs  .  Financial resource strain: Not on file  . Food insecurity    Worry: Not on file    Inability: Not on file  . Transportation needs    Medical: Not on file    Non-medical: Not on file  Tobacco Use  . Smoking status: Never Smoker  . Smokeless tobacco: Never Used  Substance and Sexual Activity  . Alcohol use: Yes    Comment: 1 drink per month  . Drug use: No  . Sexual activity: Yes    Partners: Male    Birth control/protection: I.U.D.    Comment: mirena IUD placed 08/09/17  Lifestyle  . Physical activity    Days per week: Not on file    Minutes per session: Not on file  . Stress: Not on file  Relationships  . Social Herbalist on phone: Not on file    Gets together: Not on file    Attends religious service: Not on file    Active member of club or organization: Not on file    Attends meetings of clubs or organizations: Not on file    Relationship status: Not on file  . Intimate partner violence     Fear of current or ex partner: Not on file    Emotionally abused: Not on file    Physically abused: Not on file    Forced sexual activity: Not on file  Other Topics Concern  . Not on file  Social History Narrative  . Not on file    Review of Systems  PHYSICAL EXAMINATION:    BP 108/66 (Cuff Size: Large)   Pulse 76   Temp (!) 97.5 F (36.4 C) (Temporal)   Ht 5' 3.75" (1.619 m)   Wt 183 lb 6.4 oz (83.2 kg)   BMI 31.73 kg/m     General appearance: alert, cooperative and appears stated age  Pelvic US Uterus with 3 fibroids, largest 25 mm.  IUD in endometrial canal.  String 1.9 cm from external os.  EMS 5.82 mm.  Right ovary normal.  Left ovary 38 mm cyst with clear fluid, avascular.  Left ovarian follicle 19 mm. No free fluid.  ASSESSMENT  Lost IUD strings.  IUD in proper position.  Change in menstrual pattern.  Fibroids.  Simple left ovarian cyst.  Benign features.  Right hip pain.  Unclear etiology.  PLAN  Reassurance regarding IUD position.  Discussion of uterine fibroids and their benign nature. Ovarian cysts discussed. FU [place] in 6 weeks.  Return for pelvic pain.  See PCP if right hip pain continues.    An After Visit Summary was printed and given to the patient.  __15____ minutes face to face time of which over 50% was spent in counseling.

## 2019-07-03 ENCOUNTER — Other Ambulatory Visit: Payer: 59 | Admitting: Obstetrics and Gynecology

## 2019-07-03 ENCOUNTER — Other Ambulatory Visit: Payer: 59

## 2019-07-21 ENCOUNTER — Telehealth: Payer: Self-pay | Admitting: Obstetrics and Gynecology

## 2019-07-21 NOTE — Telephone Encounter (Signed)
Call placed to patient to scheduled follow up ultrasound. Left voicemail message requesting a return call

## 2019-07-23 NOTE — Telephone Encounter (Signed)
Second call placed to patient to schedule follow up ultrasound. Left voicemail message requesting a return call

## 2019-07-29 NOTE — Telephone Encounter (Signed)
Third call placed to patient to schedule recommended ultrasound. Left voicemail message requesting a return call

## 2019-08-22 ENCOUNTER — Other Ambulatory Visit: Payer: Self-pay | Admitting: General Surgery

## 2019-08-22 DIAGNOSIS — D171 Benign lipomatous neoplasm of skin and subcutaneous tissue of trunk: Secondary | ICD-10-CM

## 2019-08-26 ENCOUNTER — Telehealth: Payer: Self-pay | Admitting: Obstetrics and Gynecology

## 2019-08-26 NOTE — Telephone Encounter (Signed)
Patient had canceled her PUS appointment in November. She asked if Dr.Silva thinks she should reschedule or wait until her aex in April?

## 2019-08-26 NOTE — Telephone Encounter (Signed)
Spoke with patient. Patient was seen in office on 06/26/19 for PUS, left ovarian cyst. 6wk rpt PUS was recommended. Patient denies any current GYN symptoms or pain.   PUS scheduled for 09/11/19 at 11am, consult at 11:30am with Dr. Quincy Simmonds. Patient is agreeable to date and time.   Routing to provider for final review. Patient is agreeable to disposition. Will close encounter.  Cc: Lerry Liner, Magdalene Patricia

## 2019-08-29 ENCOUNTER — Inpatient Hospital Stay: Admission: RE | Admit: 2019-08-29 | Payer: 59 | Source: Ambulatory Visit

## 2019-09-11 ENCOUNTER — Telehealth: Payer: Self-pay | Admitting: Obstetrics and Gynecology

## 2019-09-11 ENCOUNTER — Other Ambulatory Visit: Payer: Self-pay | Admitting: Obstetrics and Gynecology

## 2019-09-11 ENCOUNTER — Other Ambulatory Visit: Payer: 59

## 2019-09-11 NOTE — Telephone Encounter (Signed)
Left message to call Sharee Pimple, RN at [facility].    PUS for f/u of left ovarian cyst

## 2019-09-11 NOTE — Telephone Encounter (Signed)
Patient left message on answering machine cancelling appointment today because of weather.

## 2019-09-18 NOTE — Telephone Encounter (Signed)
Left message to call Daryle Boyington, RN at [facility] [phone].   

## 2019-09-24 ENCOUNTER — Ambulatory Visit
Admission: RE | Admit: 2019-09-24 | Discharge: 2019-09-24 | Disposition: A | Discharge status: Home / Self Care | Payer: 59 | Source: Ambulatory Visit | Attending: General Surgery | Admitting: General Surgery

## 2019-09-24 ENCOUNTER — Other Ambulatory Visit: Payer: Self-pay

## 2019-09-24 DIAGNOSIS — D171 Benign lipomatous neoplasm of skin and subcutaneous tissue of trunk: Secondary | ICD-10-CM

## 2019-09-24 MED ORDER — IOPAMIDOL (ISOVUE-300) INJECTION 61%
75.0000 mL | Freq: Once | INTRAVENOUS | Status: AC | PRN
  Administered 2019-09-24: 75 mL via INTRAVENOUS

## 2019-09-24 NOTE — Telephone Encounter (Signed)
Patient has not returned call to r/s PUS to date.   Standard letter pended for PUS for left ovarian cyst.   Dr. Quincy Simmonds -ok to proceed?

## 2019-09-26 NOTE — Telephone Encounter (Signed)
Please see Kaitlyn for letter modification before sending.  I have reviewed this with her.   Cc- Kaitlyn Sprague

## 2019-09-29 NOTE — Telephone Encounter (Signed)
Standard PUS letter reviewed with K. Sprague, RN.   Letter mailed via standard [place] mail.   Routing to Dr. Antony Blackbird.   Encounter closed.

## 2019-10-03 ENCOUNTER — Telehealth: Payer: Self-pay | Admitting: Obstetrics and Gynecology

## 2019-10-03 NOTE — Telephone Encounter (Signed)
Patient is ready to reschedule her PUS that had been canceled due to inclement weather.

## 2019-10-06 ENCOUNTER — Telehealth: Payer: Self-pay | Admitting: Obstetrics and Gynecology

## 2019-10-06 NOTE — Telephone Encounter (Signed)
Spoke with patient. PUS r/s for 2/25 at 11am, consult to follow at 11:30am with Dr. Quincy Simmonds. Patient verbalizes understanding and is agreeable.    Routing to provider for final review. Patient is agreeable to disposition. Will close encounter.  Cc: Magdalene Patricia, Hayley Carder

## 2019-10-06 NOTE — Telephone Encounter (Signed)
Left message to call Ledora Delker, RN at [facility] [phone].   

## 2019-10-06 NOTE — Telephone Encounter (Signed)
Call to patient. Per DPR, OK to leave message on voicemail.   Left voicemail requesting a return call to Jenny Howard to review benefits for scheduled Pelvic ultrasound with Brook A. Silva, MD, FACOG. 

## 2019-10-08 ENCOUNTER — Other Ambulatory Visit: Payer: Self-pay

## 2019-10-09 ENCOUNTER — Encounter: Payer: Self-pay | Admitting: Obstetrics and Gynecology

## 2019-10-09 ENCOUNTER — Ambulatory Visit (INDEPENDENT_AMBULATORY_CARE_PROVIDER_SITE_OTHER): Payer: 59

## 2019-10-09 ENCOUNTER — Ambulatory Visit: Payer: 59 | Admitting: Obstetrics and Gynecology

## 2019-10-09 VITALS — BP 110/70 | HR 68 | Temp 97.0°F | Ht 63.75 in | Wt 185.0 lb

## 2019-10-09 DIAGNOSIS — Z8742 Personal history of other diseases of the female genital tract: Secondary | ICD-10-CM

## 2019-10-09 DIAGNOSIS — N83202 Unspecified ovarian cyst, left side: Secondary | ICD-10-CM | POA: Diagnosis not present

## 2019-10-09 DIAGNOSIS — D219 Benign neoplasm of connective and other soft tissue, unspecified: Secondary | ICD-10-CM

## 2019-10-09 NOTE — Progress Notes (Signed)
GYNECOLOGY  VISIT   HPI: 54 y.o.   Married  Caucasian  female   G1P1001 with No LMP recorded. (Menstrual status: IUD).   here for pelvic ultrasound follow up of left ovarian cyst.   She had an ultrasound done 06/26/19 for lost IUD strings and a left ovarian cyst was noted then which measured 38 mm. Her IUD was in proper position on the endometrial canal.  Fibroids were present, the largest measuring 25 mm.   Her right hip pain can come and go.  Has seen her PCP.   GYNECOLOGIC HISTORY: No LMP recorded. (Menstrual status: IUD). Contraception: Mirena 08-09-17 Menopausal hormone therapy:  n/a Last mammogram: 11/06/17 - Bi-RADS2, cat C density Last pap smear: 11-02-16 Neg:Neg HR HPV        OB History    Gravida  1   Para  1   Term  1   Preterm      AB      Living  1     SAB      TAB      Ectopic      Multiple      Live Births                 Patient Active Problem List   Diagnosis Date Noted  . Special screening for malignant neoplasms, colon     Past Medical History:  Diagnosis Date  . 'light-for-dates' infant with signs of fetal malnutrition   . 'light-for-dates' infant with signs of fetal malnutrition   . 'light-for-dates' infant with signs of fetal malnutrition   . 'light-for-dates' infant with signs of fetal malnutrition   . 'light-for-dates' infant with signs of fetal malnutrition   . 'light-for-dates' infant with signs of fetal malnutrition   . 'light-for-dates' infant with signs of fetal malnutrition   . 'light-for-dates' infant with signs of fetal malnutrition   . 'light-for-dates' infant with signs of fetal malnutrition   . Medical history non-contributory     Past Surgical History:  Procedure Laterality Date  . CESAREAN SECTION  1990  . COLONOSCOPY N/A 12/04/2016   Procedure: COLONOSCOPY;  Surgeon: Danie Binder, MD;  [facility];  Service: Endoscopy;  Laterality: N/A;  11:15 AM  . MASS EXCISION Left 07/22/2018   Procedure:  EXCISION OF SUBMUSCULAR LEFT UPPER CHEST MASS;  Surgeon: Rolm Bookbinder, MD;  Location: [place];  Service: General;  Laterality: Left;    Current Outpatient Medications  Medication Sig Dispense Refill  . triamcinolone ointment (KENALOG) 0.5 % Apply 1 application topically as needed.     No current facility-administered medications for this visit.     ALLERGIES: Prednisone  Family History  Problem Relation Age of Onset  . Thyroid disease Mother   . Stroke Mother   . Hypertension Father   . Cancer Maternal Grandmother        Hx Thyroid Ca  . Cancer Maternal Grandfather        unknown origin  . Diabetes Paternal Grandmother   . Hyperlipidemia Paternal Grandfather   . Thyroid disease Maternal Aunt   . Colon cancer Cousin   . Breast cancer Maternal Aunt   . Thyroid disease Maternal Aunt     Social History   Socioeconomic History  . Marital status: Married    Spouse name: Not on file  . Number of children: Not on file  . Years of education: Not on file  . Highest education level: Not on file  Occupational History  .  Not on file  Tobacco Use  . Smoking status: Never Smoker  . Smokeless tobacco: Never Used  Substance and Sexual Activity  . Alcohol use: Yes    Comment: 1 drink per month  . Drug use: No  . Sexual activity: Yes    Partners: Male    Birth control/protection: I.U.D.    Comment: mirena IUD placed 08/09/17  Other Topics Concern  . Not on file  Social History Narrative  . Not on file   Social Determinants of Health   Financial Resource Strain:   . Difficulty of Paying Living Expenses: Not on file  Food Insecurity:   . Worried About Charity fundraiser in the Last Year: Not on file  . Ran Out of Food in the Last Year: Not on file  Transportation Needs:   . Lack of Transportation (Medical): Not on file  . Lack of Transportation (Non-Medical): Not on file  Physical Activity:   . Days of Exercise per Week: Not on file  . Minutes of Exercise per  Session: Not on file  Stress:   . Feeling of Stress : Not on file  Social Connections:   . Frequency of Communication with Friends and Family: Not on file  . Frequency of Social Gatherings with Friends and Family: Not on file  . Attends Religious Services: Not on file  . Active Member of Clubs or Organizations: Not on file  . Attends Archivist Meetings: Not on file  . Marital Status: Not on file  Intimate Partner Violence:   . Fear of Current or Ex-Partner: Not on file  . Emotionally Abused: Not on file  . Physically Abused: Not on file  . Sexually Abused: Not on file    Review of Systems  All other systems reviewed and are negative.   PHYSICAL EXAMINATION:    BP 110/70 (Cuff Size: Large)   Pulse 68   Temp (!) 97 F (36.1 C) (Temporal)   Ht 5' 3.75" (1.619 m)   Wt 185 lb (83.9 kg)   BMI 32.00 kg/m     General appearance: alert, cooperative and appears stated age   Pelvic US 2 fibroids - largest 1.91 mm.  EMS 6.6.  IUD in endometrial canal, normal position.  Ovaries normal.  Left ovarian cyst resolved.  Right ovarian follicle 16 mm.  No free fluid.  ASSESSMENT  Normal pelvic US.  Left ovarian cyst resolved.  Uterine fibroids.    PLAN  Review of ultrasound findings and images done.  Presence of fibroids and resolution of left ovarian cyst discussed.  FU for annual exam and prn.   An After Visit Summary was printed and given to the patient.

## 2019-12-10 ENCOUNTER — Ambulatory Visit: Payer: 59 | Admitting: Obstetrics and Gynecology

## 2020-01-07 ENCOUNTER — Ambulatory Visit: Payer: 59 | Admitting: Obstetrics and Gynecology

## 2020-03-15 NOTE — Progress Notes (Deleted)
54 y.o. G44P1001 Married Caucasian female here for annual exam.    PCP:     No LMP recorded. (Menstrual status: IUD).           Sexually active: {yes no:314532}  The current method of family planning is IUD--Mirena 08-09-17.    Exercising: {yes no:314532}  {types:19826} Smoker:  no  Health Maintenance: Pap: 11-02-16 Neg:Neg HR HPV History of abnormal Pap:  no MMG:  ***11-06-17 3D/Neg/density C/BiRads2 Colonoscopy: 12-04-16 normal;next 11/2026 BMD:   n/a  Result  n/a TDaP:  2017 Gardasil:   {YES NO:22349} HIV:*** Hep C:*** Screening Labs:  Hb today: ***, Urine today: ***   reports that she has never smoked. She has never used smokeless tobacco. She reports current alcohol use. She reports that she does not use drugs.  Past Medical History:  Diagnosis Date  . 'light-for-dates' infant with signs of fetal malnutrition   . 'light-for-dates' infant with signs of fetal malnutrition   . 'light-for-dates' infant with signs of fetal malnutrition   . 'light-for-dates' infant with signs of fetal malnutrition   . 'light-for-dates' infant with signs of fetal malnutrition   . 'light-for-dates' infant with signs of fetal malnutrition   . 'light-for-dates' infant with signs of fetal malnutrition   . 'light-for-dates' infant with signs of fetal malnutrition   . 'light-for-dates' infant with signs of fetal malnutrition   . Medical history non-contributory     Past Surgical History:  Procedure Laterality Date  . CESAREAN SECTION  1990  . COLONOSCOPY N/A 12/04/2016   Procedure: COLONOSCOPY;  Surgeon: Danie Binder, MD;  [facility];  Service: Endoscopy;  Laterality: N/A;  11:15 AM  . MASS EXCISION Left 07/22/2018   Procedure: EXCISION OF SUBMUSCULAR LEFT UPPER CHEST MASS;  Surgeon: Rolm Bookbinder, MD;  Location: [place];  Service: General;  Laterality: Left;    Current Outpatient Medications  Medication Sig Dispense Refill  . triamcinolone ointment (KENALOG) 0.5 % Apply 1  application topically as needed.     No current facility-administered medications for this visit.    Family History  Problem Relation Age of Onset  . Thyroid disease Mother   . Stroke Mother   . Hypertension Father   . Cancer Maternal Grandmother        Hx Thyroid Ca  . Cancer Maternal Grandfather        unknown origin  . Diabetes Paternal Grandmother   . Hyperlipidemia Paternal Grandfather   . Thyroid disease Maternal Aunt   . Colon cancer Cousin   . Breast cancer Maternal Aunt   . Thyroid disease Maternal Aunt     Review of Systems  Exam:   There were no vitals taken for this visit.    General appearance: alert, cooperative and appears stated age Head: normocephalic, without obvious abnormality, atraumatic Neck: no adenopathy, supple, symmetrical, trachea midline and thyroid normal to inspection and palpation Lungs: clear to auscultation bilaterally Breasts: normal appearance, no masses or tenderness, No nipple retraction or dimpling, No nipple discharge or bleeding, No axillary adenopathy Heart: regular rate and rhythm Abdomen: soft, non-tender; no masses, no organomegaly Extremities: extremities normal, atraumatic, no cyanosis or edema Skin: skin color, texture, turgor normal. No rashes or lesions Lymph nodes: cervical, supraclavicular, and axillary nodes normal. Neurologic: grossly normal  Pelvic: External genitalia:  no lesions              No abnormal inguinal nodes palpated.  Urethra:  normal appearing urethra with no masses, tenderness or lesions              Bartholins and Skenes: normal                 Vagina: normal appearing vagina with normal color and discharge, no lesions              Cervix: no lesions              Pap taken: {yes no:314532} Bimanual Exam:  Uterus:  normal size, contour, position, consistency, mobility, non-tender              Adnexa: no mass, fullness, tenderness              Rectal exam: {yes no:314532}.  Confirms.               Anus:  normal sphincter tone, no lesions  Chaperone was present for exam.  Assessment:   Well woman visit with normal exam.   Plan: Mammogram screening discussed. Self breast awareness reviewed. Pap and HR HPV as above. Guidelines for Calcium, Vitamin D, regular exercise program including cardiovascular and weight bearing exercise.   Follow up annually and prn.   Additional counseling given.  {yes [id number]. _______ minutes face to face time of which over 50% was spent in counseling.    After visit summary provided.

## 2020-03-16 ENCOUNTER — Ambulatory Visit: Payer: 59 | Admitting: Obstetrics and Gynecology

## 2020-03-16 ENCOUNTER — Encounter: Payer: Self-pay | Admitting: Obstetrics and Gynecology

## 2020-04-28 NOTE — Progress Notes (Deleted)
54 y.o. G37P1001 Married Caucasian female here for annual exam.    PCP:     No LMP recorded. (Menstrual status: IUD).           Sexually active: {yes no:314532}  The current method of family planning is IUD--Mirena 08/09/17.    Exercising: {yes no:314532}  {types:19826} Smoker:  no  Health Maintenance: Pap: 11-02-16 Neg:Neg HR HPv History of abnormal Pap:  {YES NO:22349} MMG:  ***11-06-17 3D/Neg/density C/BiRads2 Colonoscopy: 12-04-16 normal;next 10 years BMD:   ***  Result  *** TDaP: ?PCP Gardasil:   no HIV:*** Hep C: *** Screening Labs:  Hb today: ***, Urine today: ***   reports that she has never smoked. She has never used smokeless tobacco. She reports current alcohol use. She reports that she does not use drugs.  Past Medical History:  Diagnosis Date  . 'light-for-dates' infant with signs of fetal malnutrition   . 'light-for-dates' infant with signs of fetal malnutrition   . 'light-for-dates' infant with signs of fetal malnutrition   . 'light-for-dates' infant with signs of fetal malnutrition   . 'light-for-dates' infant with signs of fetal malnutrition   . 'light-for-dates' infant with signs of fetal malnutrition   . 'light-for-dates' infant with signs of fetal malnutrition   . 'light-for-dates' infant with signs of fetal malnutrition   . 'light-for-dates' infant with signs of fetal malnutrition   . Medical history non-contributory     Past Surgical History:  Procedure Laterality Date  . CESAREAN SECTION  1990  . COLONOSCOPY N/A 12/04/2016   Procedure: COLONOSCOPY;  Surgeon: Danie Binder, MD;  [facility];  Service: Endoscopy;  Laterality: N/A;  11:15 AM  . MASS EXCISION Left 07/22/2018   Procedure: EXCISION OF SUBMUSCULAR LEFT UPPER CHEST MASS;  Surgeon: Rolm Bookbinder, MD;  Location: [place];  Service: General;  Laterality: Left;    Current Outpatient Medications  Medication Sig Dispense Refill  . triamcinolone ointment (KENALOG) 0.5 % Apply 1  application topically as needed.     No current facility-administered medications for this visit.    Family History  Problem Relation Age of Onset  . Thyroid disease Mother   . Stroke Mother   . Hypertension Father   . Cancer Maternal Grandmother        Hx Thyroid Ca  . Cancer Maternal Grandfather        unknown origin  . Diabetes Paternal Grandmother   . Hyperlipidemia Paternal Grandfather   . Thyroid disease Maternal Aunt   . Colon cancer Cousin   . Breast cancer Maternal Aunt   . Thyroid disease Maternal Aunt     Review of Systems  Exam:   There were no vitals taken for this visit.    General appearance: alert, cooperative and appears stated age Head: normocephalic, without obvious abnormality, atraumatic Neck: no adenopathy, supple, symmetrical, trachea midline and thyroid normal to inspection and palpation Lungs: clear to auscultation bilaterally Breasts: normal appearance, no masses or tenderness, No nipple retraction or dimpling, No nipple discharge or bleeding, No axillary adenopathy Heart: regular rate and rhythm Abdomen: soft, non-tender; no masses, no organomegaly Extremities: extremities normal, atraumatic, no cyanosis or edema Skin: skin color, texture, turgor normal. No rashes or lesions Lymph nodes: cervical, supraclavicular, and axillary nodes normal. Neurologic: grossly normal  Pelvic: External genitalia:  no lesions              No abnormal inguinal nodes palpated.  Urethra:  normal appearing urethra with no masses, tenderness or lesions              Bartholins and Skenes: normal                 Vagina: normal appearing vagina with normal color and discharge, no lesions              Cervix: no lesions              Pap taken: {yes no:314532} Bimanual Exam:  Uterus:  normal size, contour, position, consistency, mobility, non-tender              Adnexa: no mass, fullness, tenderness              Rectal exam: {yes no:314532}.  Confirms.               Anus:  normal sphincter tone, no lesions  Chaperone was present for exam.  Assessment:   Well woman visit with normal exam.   Plan: Mammogram screening discussed. Self breast awareness reviewed. Pap and HR HPV as above. Guidelines for Calcium, Vitamin D, regular exercise program including cardiovascular and weight bearing exercise.   Follow up annually and prn.   Additional counseling given.  {yes [id number]. _______ minutes face to face time of which over 50% was spent in counseling.    After visit summary provided.

## 2020-04-29 ENCOUNTER — Telehealth: Payer: Self-pay

## 2020-04-29 ENCOUNTER — Encounter: Payer: Self-pay | Admitting: Obstetrics and Gynecology

## 2020-04-29 ENCOUNTER — Ambulatory Visit: Payer: 59 | Admitting: Obstetrics and Gynecology

## 2020-04-29 NOTE — Telephone Encounter (Signed)
Routing to Dr Quincy Simmonds for FYI Pt did not reschedule.  Has DNKA AEX and now cancelled less than 24 hrs.  Encounter closed

## 2020-04-29 NOTE — Telephone Encounter (Signed)
Patient cancelled AEX for today due to not being able to drive. Patient stated she will call back to reschedule.

## 2020-08-11 ENCOUNTER — Ambulatory Visit: Payer: 59 | Admitting: Obstetrics and Gynecology

## 2020-08-14 DIAGNOSIS — U071 COVID-19: Secondary | ICD-10-CM

## 2020-08-14 HISTORY — DX: COVID-19: U07.1

## 2020-09-19 IMAGING — US UPPER LEFT VENOUS EXTREMITY ULTRASOUND
1 series · 14 of 24 positions shown · non-contrast
Comparison: None.

CLINICAL DATA: Left upper extremity pain and edema

EXAM:
LEFT UPPER EXTREMITY VENOUS DUPLEX ULTRASOUND
TECHNIQUE: Doppler venous assessment of the left upper extremity deep venous
system was performed, including characterization of spectral flow,
compressibility, and phasicity.

[Series 1: upper left venous extremity ultrasound · 0.05mm/px · 14 of 49 slices shown]
[im 1/49]
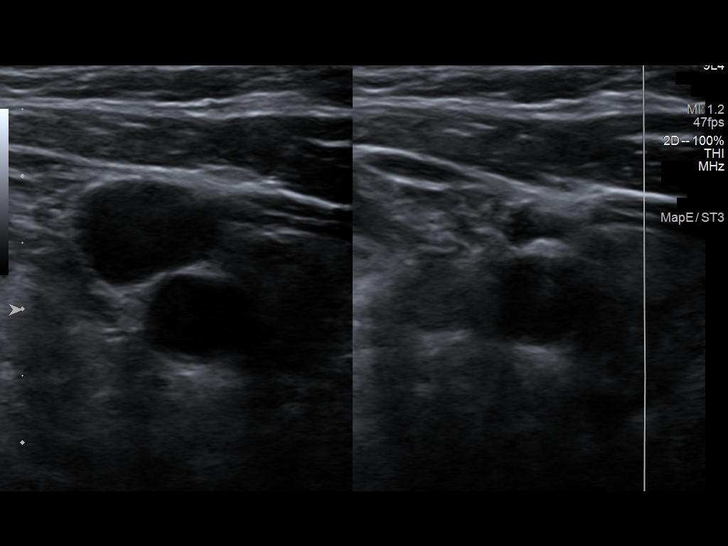
[im 5/49]
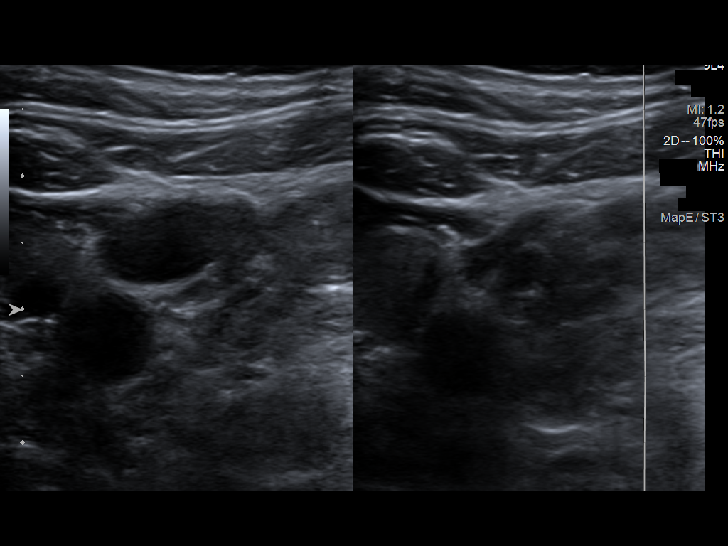
[im 9/49]
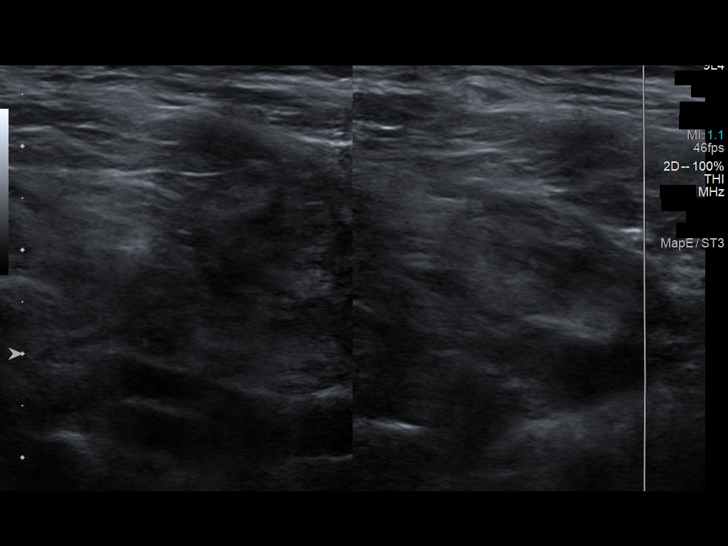
[im 13/49]
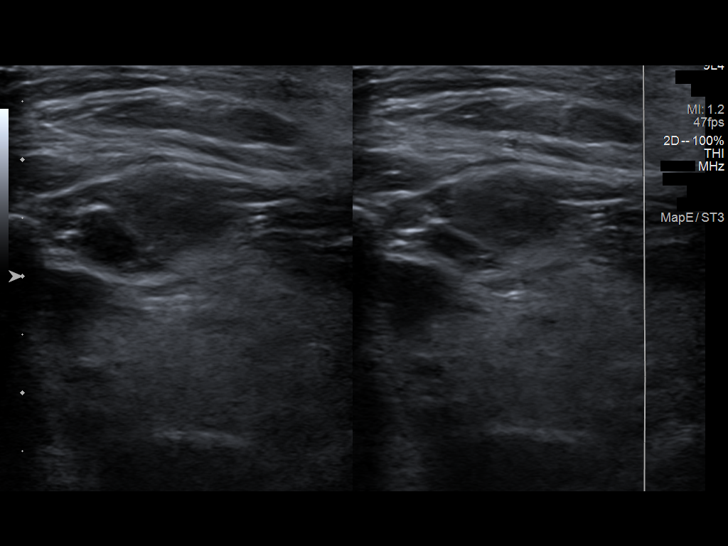
[im 15/49]
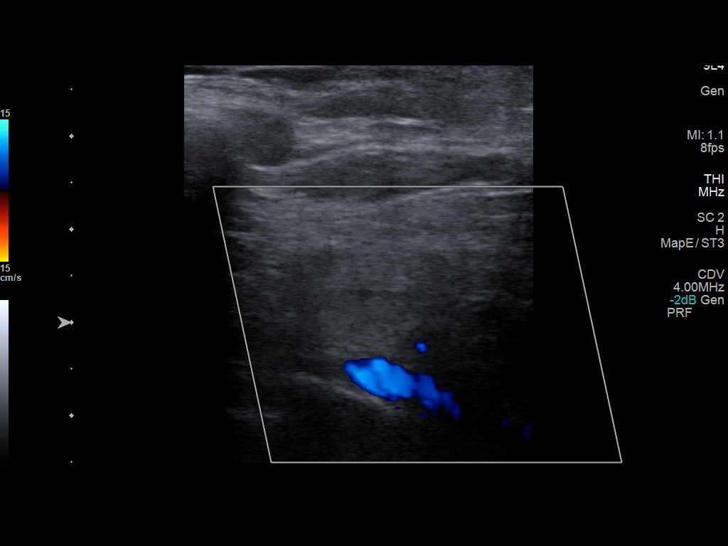
[im 19/49]
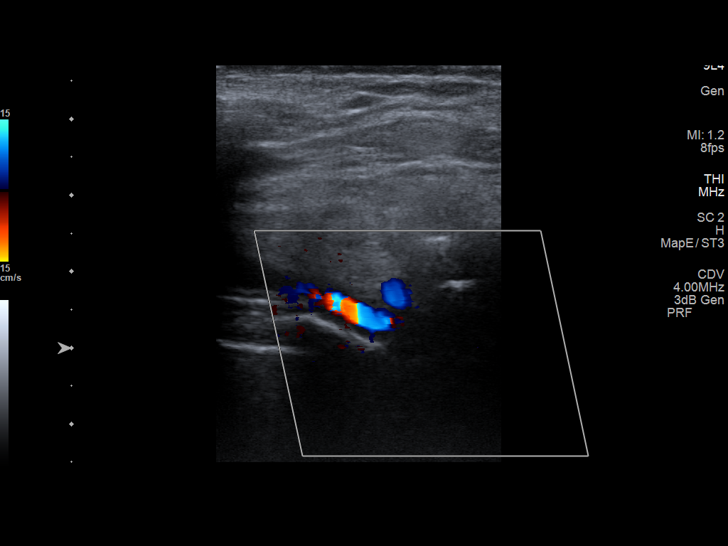
[im 23/49]
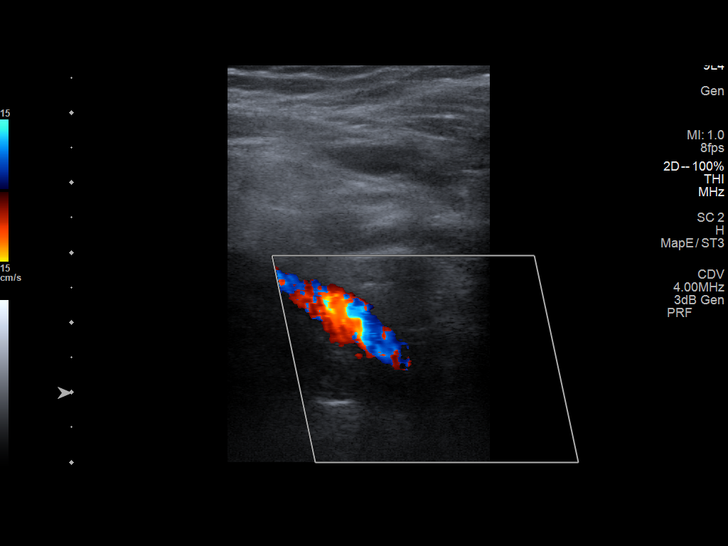
[im 26/49]
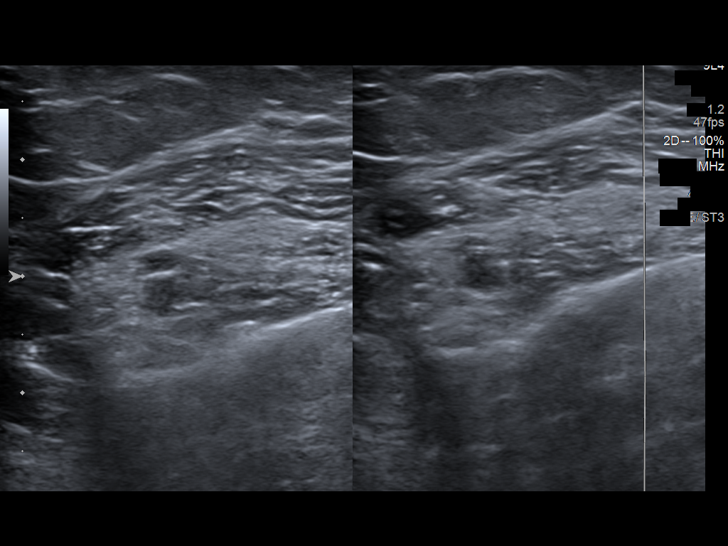
[im 30/49]
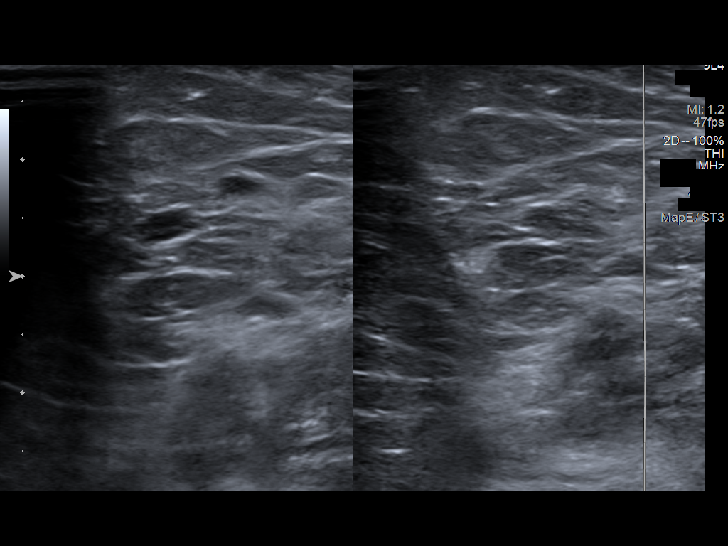
[im 34/49]
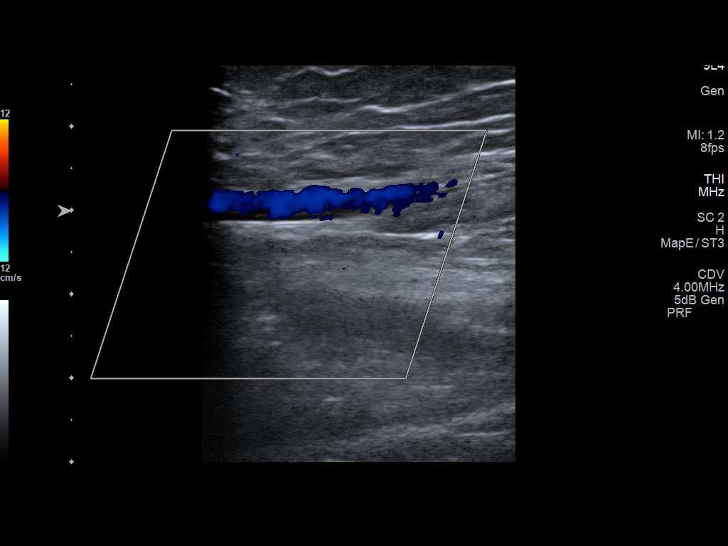
[im 38/49]
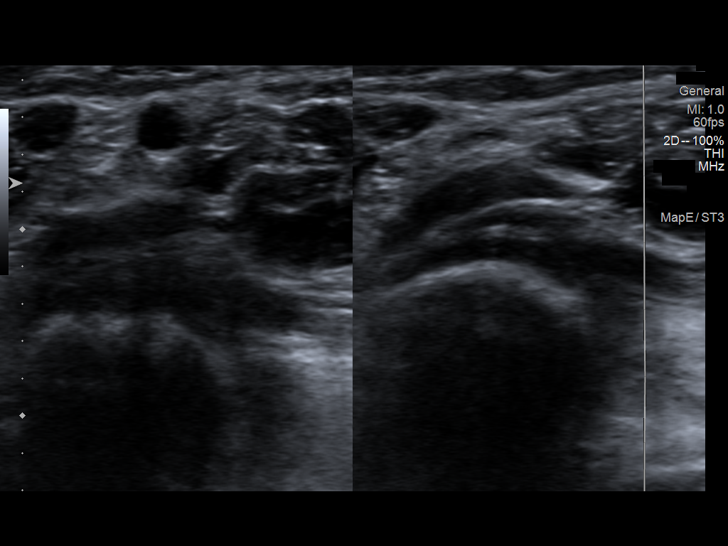
[im 40/49]
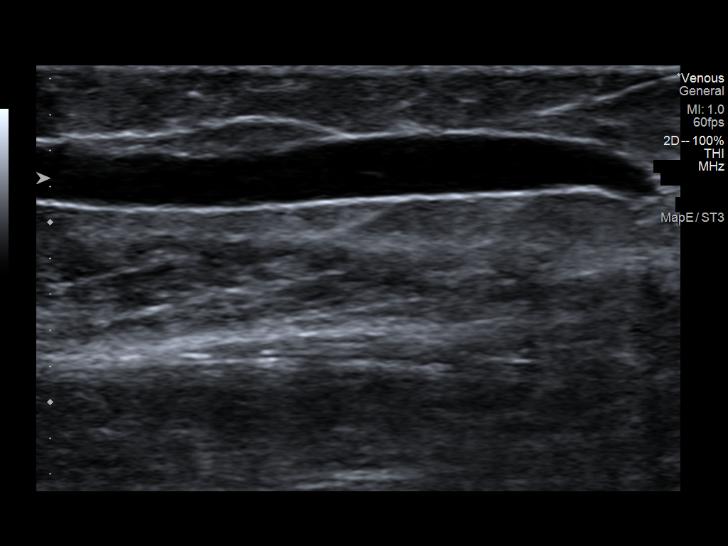
[im 44/49]
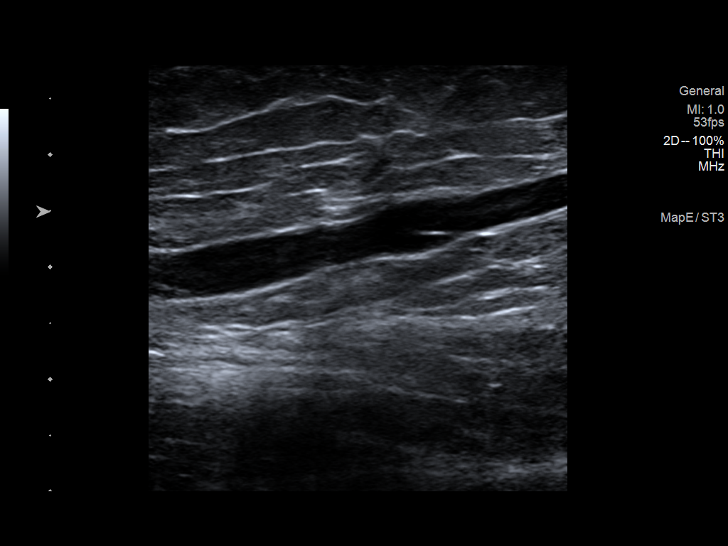
[im 49/49]
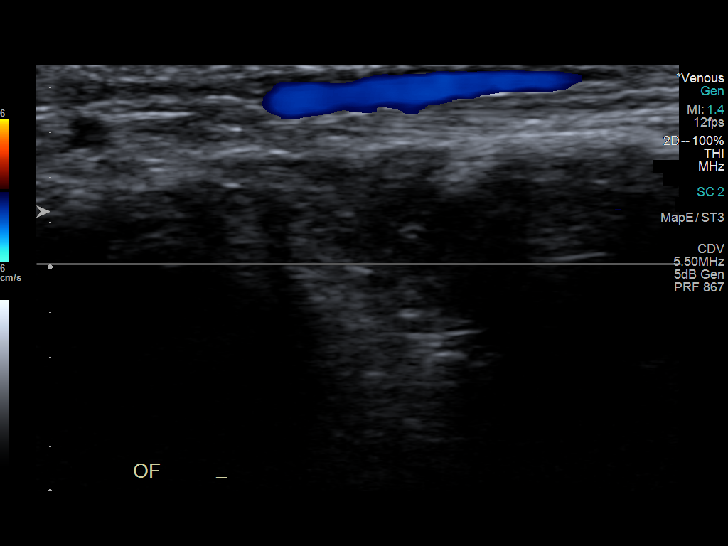

[14 of 24 positions shown; findings below may reference images not displayed]

FINDINGS: There is complete compressibility of the left jugular, subclavian,
axillary, brachial, radial, and ulnar veins. Color Doppler imaging
corroborates patency. Doppler waveform analysis demonstrates
phasicity. No obvious superficial vein thrombosis.
IMPRESSION: No evidence of left upper extremity DVT.

## 2021-01-04 NOTE — Progress Notes (Deleted)
55 y.o. G23P1001 Married Caucasian female here for annual exam.    PCP:     No LMP recorded. (Menstrual status: IUD).           Sexually active: {yes no:314532}  The current method of family planning is IUD--Mirena 08/09/17.    Exercising: {yes no:314532}  {types:19826} Smoker:  no  Health Maintenance: Pap: 11-02-16 Neg:Neg HR HPV History of abnormal Pap:  no MMG:  ***11-06-17 Neg/BiRads2 Colonoscopy: 12/04/16 Normal. f/u 10 years  BMD: n/a  Result n/a TDaP:  2017 Gardasil:   no HIV:*** Hep C:*** Screening Labs:  Hb today: ***, Urine today: ***   reports that she has never smoked. She has never used smokeless tobacco. She reports current alcohol use. She reports that she does not use drugs.  Past Medical History:  Diagnosis Date  . 'light-for-dates' infant with signs of fetal malnutrition   . 'light-for-dates' infant with signs of fetal malnutrition   . 'light-for-dates' infant with signs of fetal malnutrition   . 'light-for-dates' infant with signs of fetal malnutrition   . 'light-for-dates' infant with signs of fetal malnutrition   . 'light-for-dates' infant with signs of fetal malnutrition   . 'light-for-dates' infant with signs of fetal malnutrition   . 'light-for-dates' infant with signs of fetal malnutrition   . 'light-for-dates' infant with signs of fetal malnutrition   . Medical history non-contributory     Past Surgical History:  Procedure Laterality Date  . CESAREAN SECTION  1990  . COLONOSCOPY N/A 12/04/2016   Procedure: COLONOSCOPY;  Surgeon: Danie Binder, MD;  [facility];  Service: Endoscopy;  Laterality: N/A;  11:15 AM  . MASS EXCISION Left 07/22/2018   Procedure: EXCISION OF SUBMUSCULAR LEFT UPPER CHEST MASS;  Surgeon: Rolm Bookbinder, MD;  Location: [place];  Service: General;  Laterality: Left;    Current Outpatient Medications  Medication Sig Dispense Refill  . triamcinolone ointment (KENALOG) 0.5 % Apply 1 application topically as  needed.     No current facility-administered medications for this visit.    Family History  Problem Relation Age of Onset  . Thyroid disease Mother   . Stroke Mother   . Hypertension Father   . Cancer Maternal Grandmother        Hx Thyroid Ca  . Cancer Maternal Grandfather        unknown origin  . Diabetes Paternal Grandmother   . Hyperlipidemia Paternal Grandfather   . Thyroid disease Maternal Aunt   . Colon cancer Cousin   . Breast cancer Maternal Aunt   . Thyroid disease Maternal Aunt     Review of Systems  Exam:   There were no vitals taken for this visit.    General appearance: alert, cooperative and appears stated age Head: normocephalic, without obvious abnormality, atraumatic Neck: no adenopathy, supple, symmetrical, trachea midline and thyroid normal to inspection and palpation Lungs: clear to auscultation bilaterally Breasts: normal appearance, no masses or tenderness, No nipple retraction or dimpling, No nipple discharge or bleeding, No axillary adenopathy Heart: regular rate and rhythm Abdomen: soft, non-tender; no masses, no organomegaly Extremities: extremities normal, atraumatic, no cyanosis or edema Skin: skin color, texture, turgor normal. No rashes or lesions Lymph nodes: cervical, supraclavicular, and axillary nodes normal. Neurologic: grossly normal  Pelvic: External genitalia:  no lesions              No abnormal inguinal nodes palpated.              Urethra:  normal appearing urethra with no masses, tenderness or lesions              Bartholins and Skenes: normal                 Vagina: normal appearing vagina with normal color and discharge, no lesions              Cervix: no lesions              Pap taken: {yes no:314532} Bimanual Exam:  Uterus:  normal size, contour, position, consistency, mobility, non-tender              Adnexa: no mass, fullness, tenderness              Rectal exam: {yes no:314532}.  Confirms.              Anus:  normal  sphincter tone, no lesions  Chaperone was present for exam.  Assessment:   Well woman visit with normal exam.   Plan: Mammogram screening discussed. Self breast awareness reviewed. Pap and HR HPV as above. Guidelines for Calcium, Vitamin D, regular exercise program including cardiovascular and weight bearing exercise.   Follow up annually and prn.   Additional counseling given.  {yes [id number]. _______ minutes face to face time of which over 50% was spent in counseling.    After visit summary provided.

## 2021-01-05 ENCOUNTER — Other Ambulatory Visit: Payer: Self-pay

## 2021-01-05 ENCOUNTER — Ambulatory Visit (INDEPENDENT_AMBULATORY_CARE_PROVIDER_SITE_OTHER): Payer: 59 | Admitting: Obstetrics and Gynecology

## 2021-01-05 ENCOUNTER — Encounter: Payer: Self-pay | Admitting: Obstetrics and Gynecology

## 2021-01-05 ENCOUNTER — Ambulatory Visit: Payer: 59 | Admitting: Obstetrics and Gynecology

## 2021-01-05 VITALS — BP 112/78 | HR 66 | Ht 64.0 in | Wt 174.0 lb

## 2021-01-05 DIAGNOSIS — Z01419 Encounter for gynecological examination (general) (routine) without abnormal findings: Secondary | ICD-10-CM

## 2021-01-05 DIAGNOSIS — N951 Menopausal and female climacteric states: Secondary | ICD-10-CM

## 2021-01-05 NOTE — Patient Instructions (Signed)

## 2021-01-05 NOTE — Progress Notes (Signed)
55 y.o. G48P1001 Married Caucasian female here for annual exam.    No menses at all.   Has had a pelvic US done in past for lost IUD strings.  She has fibroids. Notices extra heat and sweating on her neck with physical activity.   She had a chest wall mass removed and she has some hand pain following this so she is not able to work.  Stopped Gabapentin and Cymbalta.  Has episodes of feeling angry.  Mother in law passed away.  She had dementia.  Father had prostate cancer, and he is doing ok.   Has been working in the yard and had exposures to pine needles and developed a rash on her skin - chest and arms.   Has not done Covid vaccine.  Had Covid in January, 2022.  PCP: Sharilyn Sites, MD    No LMP recorded. (Menstrual status: IUD).           Sexually active: Yes.    The current method of family planning is IUD--Mirena 08/09/17.    Exercising: Yes.    works out at gym Smoker:  no  Health Maintenance: Pap: 11-02-16 Neg:Neg HR HPV History of abnormal Pap:  no MMG:  11-06-17 Neg/BiRads2--Had MMG today/pending Colonoscopy:  4/23/18Normal.f/u 10 years  BMD:   n/a  Result  n/a TDaP:  2017  Gardasil:   no HIV: Neg with PCP in past Hep C: Neg with PCP in past Screening Labs:   PCP.    reports that she has never smoked. She has never used smokeless tobacco. She reports previous alcohol use. She reports that she does not use drugs.  Past Medical History:  Diagnosis Date  . 'light-for-dates' infant with signs of fetal malnutrition   . 'light-for-dates' infant with signs of fetal malnutrition   . 'light-for-dates' infant with signs of fetal malnutrition   . 'light-for-dates' infant with signs of fetal malnutrition   . 'light-for-dates' infant with signs of fetal malnutrition   . 'light-for-dates' infant with signs of fetal malnutrition   . 'light-for-dates' infant with signs of fetal malnutrition   . 'light-for-dates' infant with signs of fetal malnutrition   .  'light-for-dates' infant with signs of fetal malnutrition   . COVID 08/2020  . Medical history non-contributory     Past Surgical History:  Procedure Laterality Date  . CESAREAN SECTION  1990  . COLONOSCOPY N/A 12/04/2016   Procedure: COLONOSCOPY;  Surgeon: Danie Binder, MD;  [facility];  Service: Endoscopy;  Laterality: N/A;  11:15 AM  . MASS EXCISION Left 07/22/2018   Procedure: EXCISION OF SUBMUSCULAR LEFT UPPER CHEST MASS;  Surgeon: Rolm Bookbinder, MD;  Location: [place];  Service: General;  Laterality: Left;    Current Outpatient Medications  Medication Sig Dispense Refill  . DULoxetine (CYMBALTA) 30 MG capsule Take 30 mg by mouth 2 (two) times daily.    . phentermine (ADIPEX-P) 37.5 MG tablet Take 37.5 mg by mouth every morning.     No current facility-administered medications for this visit.    Family History  Problem Relation Age of Onset  . Thyroid disease Mother   . Stroke Mother   . Hypertension Father   . Cancer Maternal Grandmother        Hx Thyroid Ca  . Cancer Maternal Grandfather        unknown origin  . Diabetes Paternal Grandmother   . Hyperlipidemia Paternal Grandfather   . Thyroid disease Maternal Aunt   . Colon cancer Cousin   .  Breast cancer Maternal Aunt   . Thyroid disease Maternal Aunt     Review of Systems  All other systems reviewed and are negative.   Exam:   BP 112/78   Pulse 66   Ht 5\' 4"  (1.626 m)   Wt 174 lb (78.9 kg)   SpO2 96%   BMI 29.87 kg/m     General appearance: alert, cooperative and appears stated age Head: normocephalic, without obvious abnormality, atraumatic Neck: no adenopathy, supple, symmetrical, trachea midline and thyroid normal to inspection and palpation Lungs: clear to auscultation bilaterally Breasts: normal appearance, no masses or tenderness, No nipple retraction or dimpling, No nipple discharge or bleeding, No axillary adenopathy Heart: regular rate and rhythm Abdomen: soft, non-tender; no  masses, no organomegaly Extremities: extremities normal, atraumatic, no cyanosis or edema Skin: skin color, texture, turgor normal.  Excoriated lesions of the chest.  Lymph nodes: cervical, supraclavicular, and axillary nodes normal. Neurologic: grossly normal  Pelvic: External genitalia:  no lesions              No abnormal inguinal nodes palpated.              Urethra:  normal appearing urethra with no masses, tenderness or lesions              Bartholins and Skenes: normal                 Vagina: normal appearing vagina with normal color and discharge, no lesions              Cervix: no lesions.  IUD strings not seen.              Pap taken: No. Bimanual Exam:  Uterus:  normal size, contour, position, consistency, mobility, non-tender              Adnexa: no mass, fullness, tenderness              Rectal exam: Yes.  .  Confirms.              Anus:  normal sphincter tone, no lesions  Chaperone was present for exam.  Assessment:   Well woman visit with normal exam. Mirena IUD with lost strings and position confirmed on ultrasound.  Fibroids.  Menopausal symptoms. Status post lipoma excision of chest.   Skin excoriation.  Reaction to pine needles.   Plan: Mammogram screening pending.  Self breast awareness reviewed. Pap and HR HPV as above. Guidelines for Calcium, Vitamin D, regular exercise program including cardiovascular and weight bearing exercise. FSH, estradiol, and TSH.  Follow up annually and prn.

## 2021-01-06 LAB — ESTRADIOL: Estradiol: <15 pg/mL

## 2021-01-06 LAB — FOLLICLE STIMULATING HORMONE: FSH: 75.4 m[IU]/mL

## 2021-01-06 LAB — TSH: TSH: 1.54 mIU/L

## 2021-10-31 IMAGING — CT CT CHEST W/ CM
1 series · 15 of 34 positions shown, 19 images · IV contrast (APPLIED)
Comparison: 08/16/2018

CLINICAL DATA: Excision of left shoulder lipoma

EXAM:
CT CHEST WITH CONTRAST
TECHNIQUE: Multidetector CT imaging of the chest was performed during
intravenous contrast administration.
CONTRAST:  75mL Y5B3Q7-DSS IOPAMIDOL (Y5B3Q7-DSS) INJECTION 61%

[Series 2: chest w/cm · axial · 0.65mm/px · z∈[-280,-16]mm · 15 of 156 slices shown, 19 images]
[im 12/156  mediastinal]
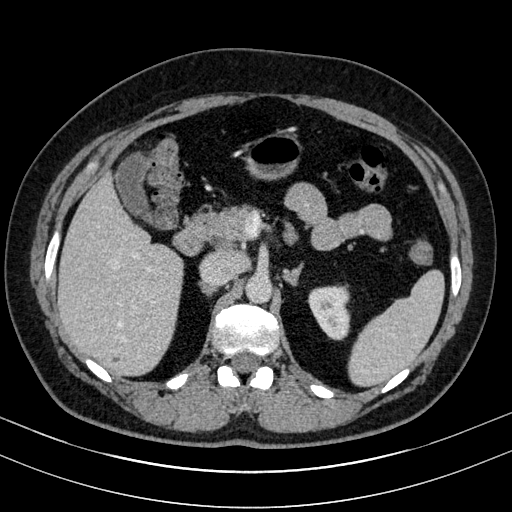
[im 12/156  lung]
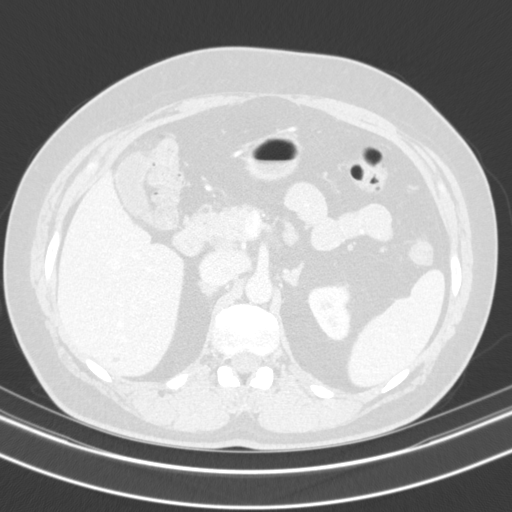
[im 23/156  lung]
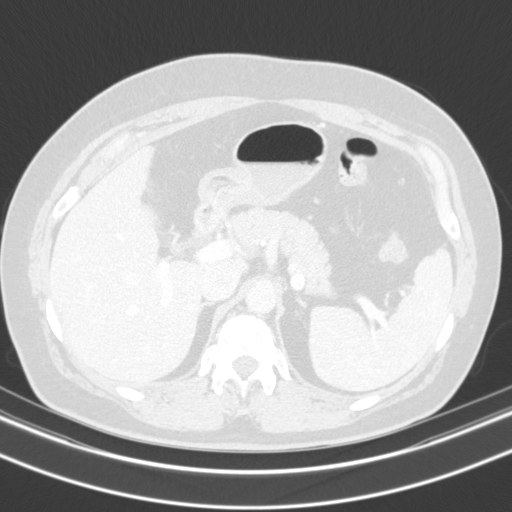
[im 32/156  lung]
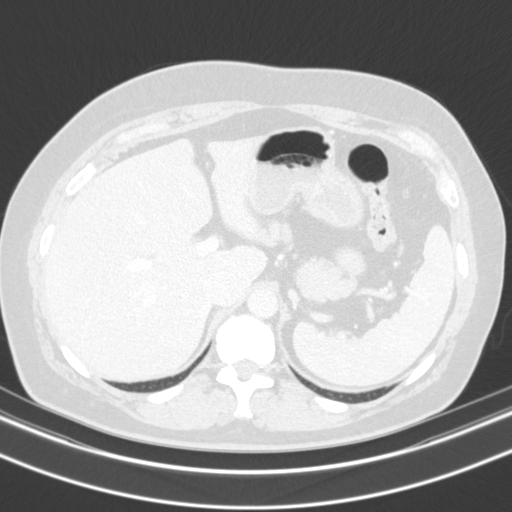
[im 41/156  lung]
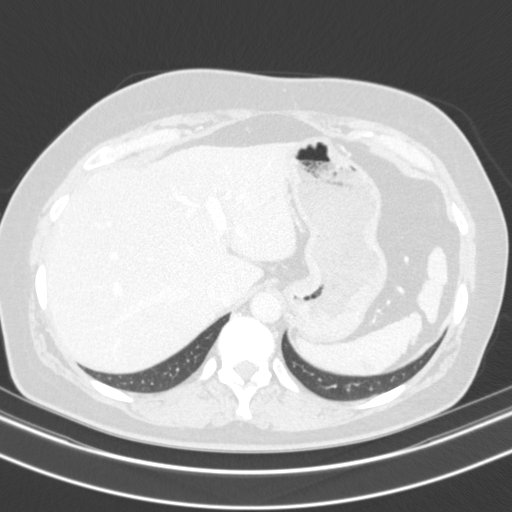
[im 52/156  mediastinal]
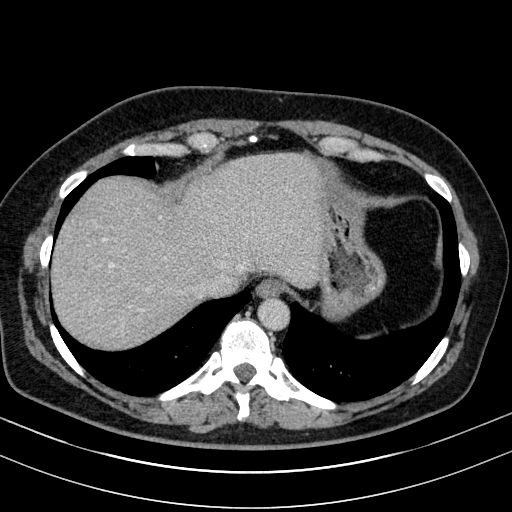
[im 52/156  lung]
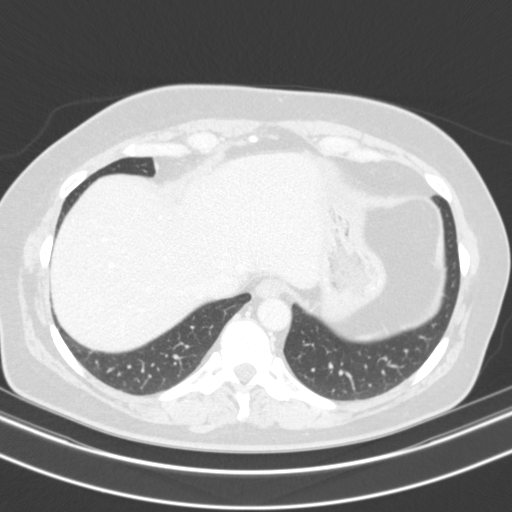
[im 63/156  lung]
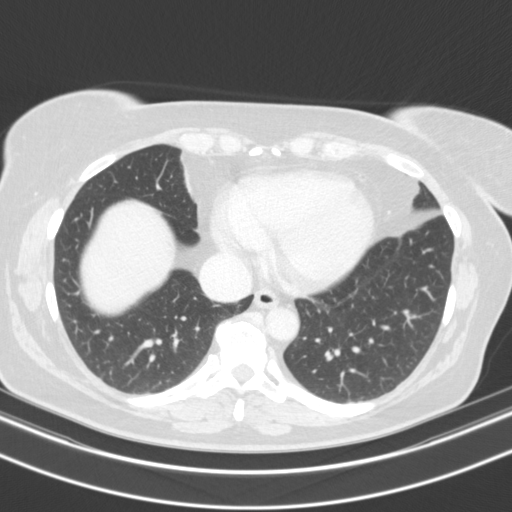
[im 69/156  lung]
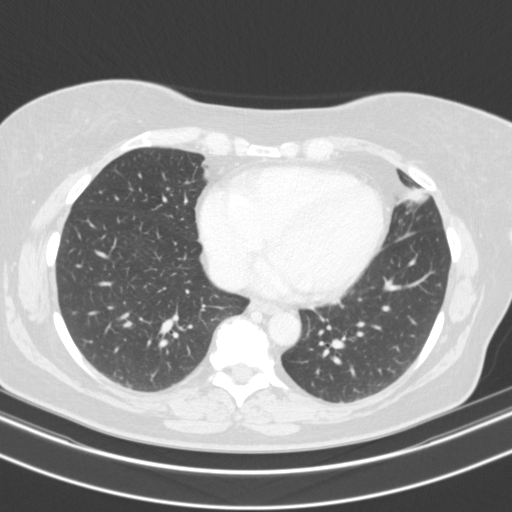
[im 81/156  lung]
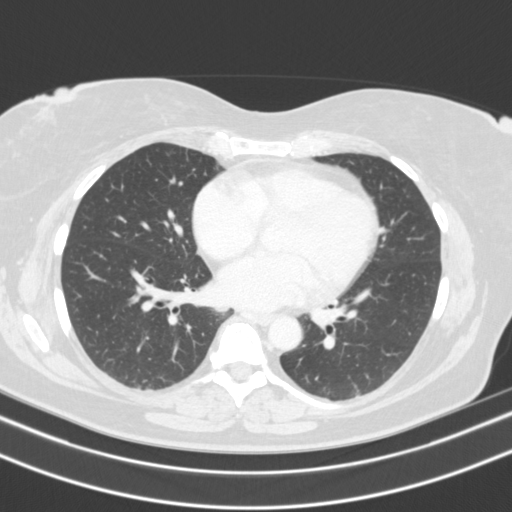
[im 87/156  mediastinal]
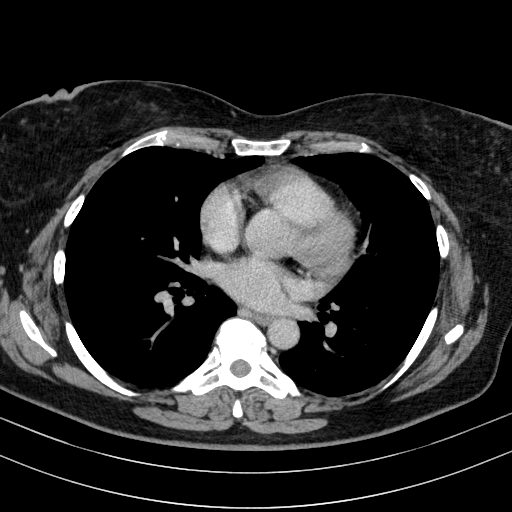
[im 87/156  lung]
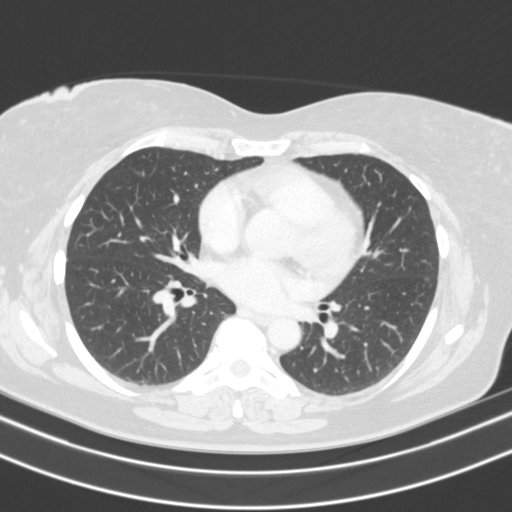
[im 94/156  lung]
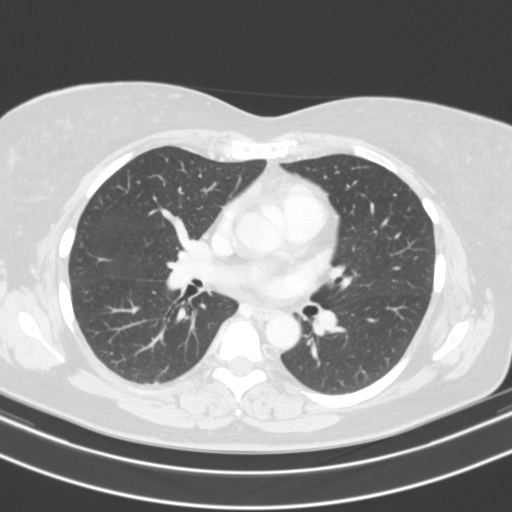
[im 104/156  lung]
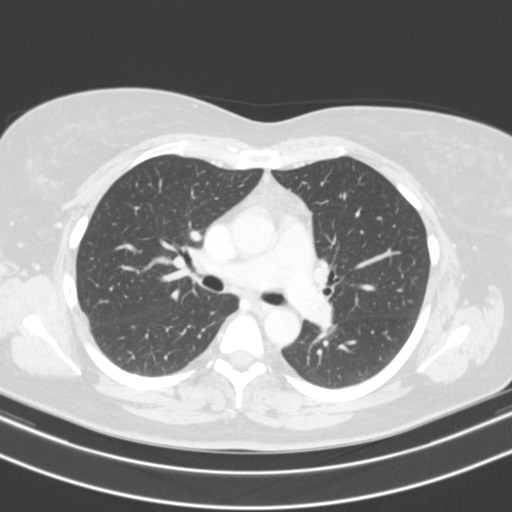
[im 115/156  lung]
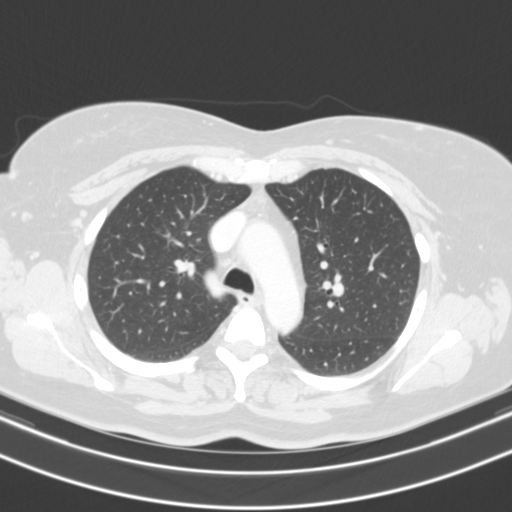
[im 125/156  mediastinal]
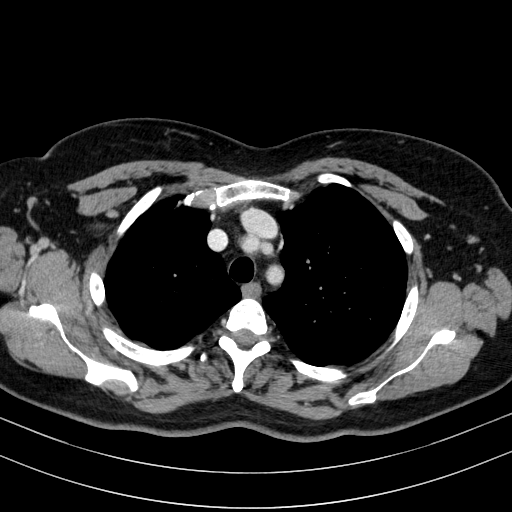
[im 125/156  lung]
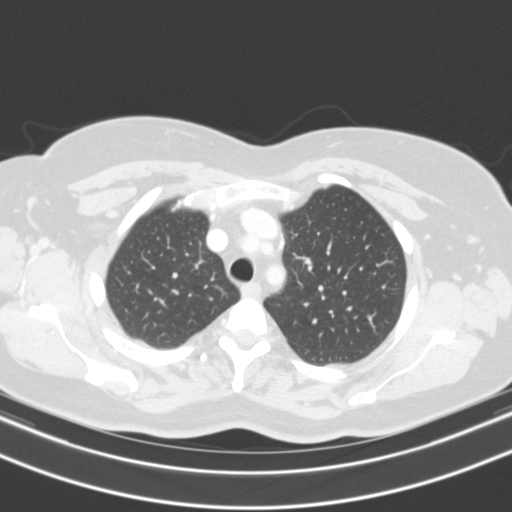
[im 133/156  lung]
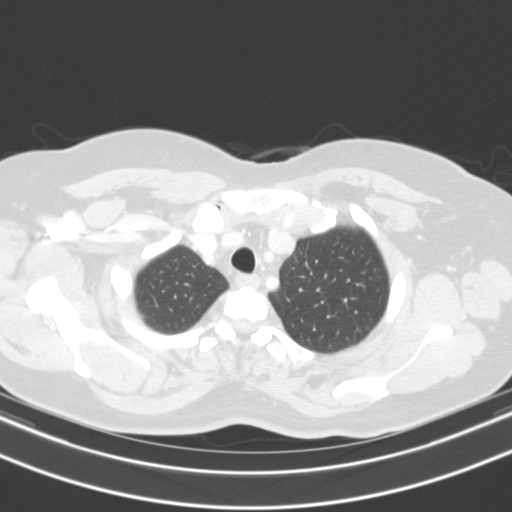
[im 144/156  lung]
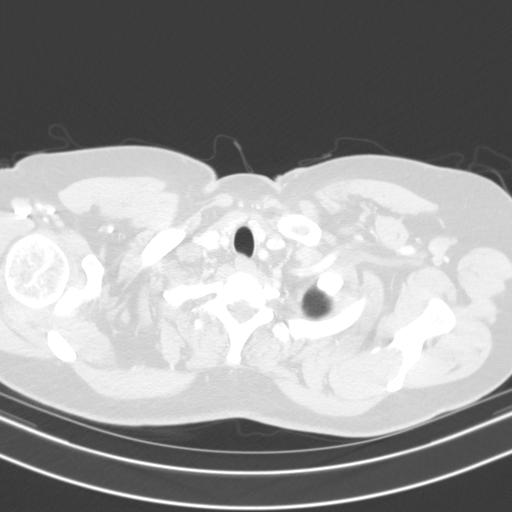

[15 of 34 positions shown; findings below may reference images not displayed]

FINDINGS: Cardiovascular: The heart and great vessels are unremarkable without
pericardial effusion. Thoracic aorta is normal in caliber.

Mediastinum/Nodes: No enlarged mediastinal, hilar, or axillary lymph
nodes. Thyroid gland, trachea, and esophagus demonstrate no
significant findings.

Lungs/Pleura: No airspace disease, effusion, or pneumothorax.
Chronic scarring within the lingula. Central airways are widely
patent.

Upper Abdomen: No acute abnormality.

Musculoskeletal: Postsurgical changes are seen from left anterior
chest wall mass resection. Surgical clips are seen along the deep
margin of the left pectoralis muscle. No recurrent mass.

There are no acute displaced fractures. Reconstructed images
demonstrate no additional findings.
IMPRESSION: 1. No acute intrathoracic process.

## 2022-01-23 ENCOUNTER — Encounter: Payer: Self-pay | Admitting: Obstetrics and Gynecology

## 2022-01-23 ENCOUNTER — Ambulatory Visit (INDEPENDENT_AMBULATORY_CARE_PROVIDER_SITE_OTHER): Payer: 59 | Admitting: Obstetrics and Gynecology

## 2022-01-23 ENCOUNTER — Other Ambulatory Visit (HOSPITAL_COMMUNITY)
Admission: RE | Admit: 2022-01-23 | Discharge: 2022-01-23 | Disposition: A | Discharge status: Home / Self Care | Payer: 59 | Source: Ambulatory Visit | Attending: Obstetrics and Gynecology | Admitting: Obstetrics and Gynecology

## 2022-01-23 VITALS — BP 110/64 | HR 98 | Ht 64.0 in | Wt 180.0 lb

## 2022-01-23 DIAGNOSIS — Z124 Encounter for screening for malignant neoplasm of cervix: Secondary | ICD-10-CM | POA: Insufficient documentation

## 2022-01-23 DIAGNOSIS — N951 Menopausal and female climacteric states: Secondary | ICD-10-CM

## 2022-01-23 DIAGNOSIS — Z01419 Encounter for gynecological examination (general) (routine) without abnormal findings: Secondary | ICD-10-CM | POA: Diagnosis not present

## 2022-01-23 NOTE — Progress Notes (Signed)
56 y.o. G95P1001 Married Caucasian female here for annual exam.    Does wake up early some nights with increased heat.  Not bothersome during the day.  Noticing some increased irritation.  Lots of family stress.   FSH 75.4 and estradiol < 15 on 01/05/21.  Taking Phentermine.  She is also taking hCG injections.  No vaginal bleeding.   PCP:  Sharilyn Sites, MD   No LMP recorded. (Menstrual status: IUD).     Period Cycle (Days):  (no cycles with Mirena)     Sexually active: Yes.    The current method of family planning is IUD--Mirena 08/09/17.    Exercising: No.   Trying to begin again Smoker:  no  Health Maintenance: Pap:   11-02-16 Neg:Neg HR HPV History of abnormal Pap:  no MMG:  01-23-22 pend at [facility] Colonoscopy:   12/04/16 Normal. f/u 10 years  BMD:   n/a  Result  n/a TDaP:  2017 Gardasil:   no HIV: Neg with PCP Hep C: Neg with PCP Screening Labs:  PCP   reports that she has never smoked. She has never used smokeless tobacco. She reports that she does not currently use alcohol. She reports that she does not use drugs.  Past Medical History:  Diagnosis Date   'light-for-dates' infant with signs of fetal malnutrition    'light-for-dates' infant with signs of fetal malnutrition    'light-for-dates' infant with signs of fetal malnutrition    'light-for-dates' infant with signs of fetal malnutrition    'light-for-dates' infant with signs of fetal malnutrition    'light-for-dates' infant with signs of fetal malnutrition    'light-for-dates' infant with signs of fetal malnutrition    'light-for-dates' infant with signs of fetal malnutrition    'light-for-dates' infant with signs of fetal malnutrition    COVID 08/2020   Medical history non-contributory     Past Surgical History:  Procedure Laterality Date   CESAREAN SECTION  1990   COLONOSCOPY N/A 12/04/2016   Procedure: COLONOSCOPY;  Surgeon: Danie Binder, MD;  [facility];  Service: Endoscopy;  Laterality:  N/A;  11:15 AM   MASS EXCISION Left 07/22/2018   Procedure: EXCISION OF SUBMUSCULAR LEFT UPPER CHEST MASS;  Surgeon: Rolm Bookbinder, MD;  Location: [place];  Service: General;  Laterality: Left;    Current Outpatient Medications  Medication Sig Dispense Refill   lidocaine (LIDODERM) 5 % Lidoderm 5 % topical patch  APPLY 1 PATCH BY TOPICAL ROUTE ONCE DAILY (MAY WEAR UP TO 12HOURS.)     phentermine (ADIPEX-P) 37.5 MG tablet Take 37.5 mg by mouth every morning.     No current facility-administered medications for this visit.    Family History  Problem Relation Age of Onset   Thyroid disease Mother    Stroke Mother    Hypertension Father    Cancer Maternal Grandmother        Hx Thyroid Ca   Cancer Maternal Grandfather        unknown origin   Diabetes Paternal Grandmother    Hyperlipidemia Paternal Grandfather    Thyroid disease Maternal Aunt    Colon cancer Cousin    Breast cancer Maternal Aunt    Thyroid disease Maternal Aunt     Review of Systems  Constitutional:  Positive for unexpected weight change (weight gain).  Psychiatric/Behavioral:  Positive for agitation (gets angry with people--due to menopause symptoms).   All other systems reviewed and are negative.   Exam:   BP 110/64  Pulse 98   Ht '5\' 4"'$  (1.626 m)   Wt 180 lb (81.6 kg)   SpO2 98%   BMI 30.90 kg/m     General appearance: alert, cooperative and appears stated age Head: normocephalic, without obvious abnormality, atraumatic Neck: no adenopathy, supple, symmetrical, trachea midline and thyroid normal to inspection and palpation Lungs: clear to auscultation bilaterally Breasts: normal appearance, no masses or tenderness, No nipple retraction or dimpling, No nipple discharge or bleeding, No axillary adenopathy Heart: regular rate and rhythm Abdomen: soft, non-tender; no masses, no organomegaly Extremities: extremities normal, atraumatic, no cyanosis or edema Skin: skin color, texture, turgor normal. No  rashes or lesions Lymph nodes: cervical, supraclavicular, and axillary nodes normal. Neurologic: grossly normal  Pelvic: External genitalia:  no lesions              No abnormal inguinal nodes palpated.              Urethra:  normal appearing urethra with no masses, tenderness or lesions              Bartholins and Skenes: normal                 Vagina: normal appearing vagina with normal color and discharge, no lesions              Cervix: no lesions.  IUD strings not seen. Cervix is stenotic in appearance.               Pap taken: yes Bimanual Exam:  Uterus:  normal size, contour, position, consistency, mobility, non-tender              Adnexa: no mass, fullness, tenderness              Rectal exam: yes.  Confirms.              Anus:  normal sphincter tone, no lesions  Chaperone was present for exam:  Estill Bamberg, CMA  Assessment:   Well woman visit with gynecologic exam. Mirena IUD with lost strings and position confirmed on ultrasound.  Stenotic cervix.  Fibroids.  Menopausal symptoms. Status post lipoma excision of chest.    Plan: Mammogram screening discussed.   Self breast awareness reviewed. Pap and HR HPV collected. Guidelines for Calcium, Vitamin D, regular exercise program including cardiovascular and weight bearing exercise. FSH, estradiol, and TSH. Will determine potential need for IUD removal after hormonal testing is finalized.  Follow up annually and prn.    After visit summary provided.

## 2022-01-23 NOTE — Patient Instructions (Signed)

## 2022-01-24 ENCOUNTER — Other Ambulatory Visit: Payer: Self-pay

## 2022-01-24 DIAGNOSIS — Z30432 Encounter for removal of intrauterine contraceptive device: Secondary | ICD-10-CM

## 2022-01-24 LAB — FOLLICLE STIMULATING HORMONE: FSH: 67.3 m[IU]/mL

## 2022-01-24 LAB — ESTRADIOL: Estradiol: <15 pg/mL

## 2022-01-24 LAB — TSH: TSH: 1.65 mIU/L (ref 0.40–4.50)

## 2022-01-25 LAB — CYTOLOGY - PAP
Adequacy: ABSENT
Comment: NEGATIVE
Diagnosis: NEGATIVE
High risk HPV: NEGATIVE

## 2022-02-01 ENCOUNTER — Encounter: Payer: Self-pay | Admitting: Obstetrics and Gynecology

## 2022-02-20 ENCOUNTER — Telehealth: Payer: Self-pay

## 2022-02-20 NOTE — Telephone Encounter (Signed)
We can try removing her IUD without using lidocaine in her cervix.   It may be uncomfortable to dilate her cervix and remove the IUD without lidocaine.  Her cervix looked closed on office exam and the IUD strings are not visible.  Prior ultrasound indicates that the IUD is in a proper location.   Please let me know what questions or concerns she may have about lidocaine.   The injection usually feels like cramp that is very short lived.

## 2022-02-20 NOTE — Telephone Encounter (Signed)
Patient called in voice mail stating she had questions about her IUD removal on 03/28/22.    After IUD removed will I go back to the heavy bleeding/menses that I had before? (Was inserted to treat some very heavy bleeding with menses she was having).  I reassured patient that her hormone testing indicates menopause for two years in a row and she will not be having menses any longer.  2. Patient asked if a needle would be involved in dilating her cervix. If so, she wanted to let Dr. Quincy Simmonds know she does not want to be numbed.

## 2022-02-21 NOTE — Telephone Encounter (Signed)
Left message to call.

## 2022-02-22 ENCOUNTER — Ambulatory Visit: Payer: 59 | Admitting: Obstetrics and Gynecology

## 2022-03-01 NOTE — Telephone Encounter (Signed)
Left another voice mail message for patient. I told her I had reply from Dr. Quincy Simmonds regarding her concerns she had called about. I told her I will message Dr. Elza Rafter reply in My Chart if she will login and read it or she is welcome to return my call and I will read it to her.

## 2022-03-27 NOTE — Progress Notes (Deleted)
GYNECOLOGY  VISIT   HPI: 56 y.o.   Married  Caucasian  female   G1P1001 with No LMP recorded. (Menstrual status: IUD).   here for Mirena IUD removal.   GYNECOLOGIC HISTORY: No LMP recorded. (Menstrual status: IUD). Contraception:  Mirena IUD 08/09/17 Menopausal hormone therapy:  None Last mammogram:  01-23-22 Neg/Birads1 Last pap smear: 11-02-16 Neg:Neg HR HPV         OB History     Gravida  1   Para  1   Term  1   Preterm      AB      Living  1      SAB      IAB      Ectopic      Multiple      Live Births                 Patient Active Problem List   Diagnosis Date Noted   Special screening for malignant neoplasms, colon     Past Medical History:  Diagnosis Date   'light-for-dates' infant with signs of fetal malnutrition    'light-for-dates' infant with signs of fetal malnutrition    'light-for-dates' infant with signs of fetal malnutrition    'light-for-dates' infant with signs of fetal malnutrition    'light-for-dates' infant with signs of fetal malnutrition    'light-for-dates' infant with signs of fetal malnutrition    'light-for-dates' infant with signs of fetal malnutrition    'light-for-dates' infant with signs of fetal malnutrition    'light-for-dates' infant with signs of fetal malnutrition    COVID 08/2020   Medical history non-contributory     Past Surgical History:  Procedure Laterality Date   CESAREAN SECTION  1990   COLONOSCOPY N/A 12/04/2016   Procedure: COLONOSCOPY;  Surgeon: Danie Binder, MD;  [facility];  Service: Endoscopy;  Laterality: N/A;  11:15 AM   MASS EXCISION Left 07/22/2018   Procedure: EXCISION OF SUBMUSCULAR LEFT UPPER CHEST MASS;  Surgeon: Rolm Bookbinder, MD;  Location: [place];  Service: General;  Laterality: Left;    Current Outpatient Medications  Medication Sig Dispense Refill   lidocaine (LIDODERM) 5 % Lidoderm 5 % topical patch  APPLY 1 PATCH BY TOPICAL ROUTE ONCE DAILY (MAY WEAR UP TO  12HOURS.)     phentermine (ADIPEX-P) 37.5 MG tablet Take 37.5 mg by mouth every morning.     No current facility-administered medications for this visit.     ALLERGIES: Methylprednisolone and Prednisone  Family History  Problem Relation Age of Onset   Thyroid disease Mother    Stroke Mother    Hypertension Father    Cancer Maternal Grandmother        Hx Thyroid Ca   Cancer Maternal Grandfather        unknown origin   Diabetes Paternal Grandmother    Hyperlipidemia Paternal Grandfather    Thyroid disease Maternal Aunt    Colon cancer Cousin    Breast cancer Maternal Aunt    Thyroid disease Maternal Aunt     Social History   Socioeconomic History   Marital status: Married    Spouse name: Not on file   Number of children: Not on file   Years of education: Not on file   Highest education level: Not on file  Occupational History   Not on file  Tobacco Use   Smoking status: Never   Smokeless tobacco: Never  Vaping Use   Vaping Use: Never used  Substance and Sexual Activity   Alcohol use: Not Currently    Comment: 2-3 drinks/month   Drug use: No   Sexual activity: Yes    Partners: Male    Birth control/protection: I.U.D.    Comment: mirena IUD placed 08/09/17  Other Topics Concern   Not on file  Social History Narrative   Not on file   Social Determinants of Health   Financial Resource Strain: Not on file  Food Insecurity: Not on file  Transportation Needs: Not on file  Physical Activity: Not on file  Stress: Not on file  Social Connections: Not on file  Intimate Partner Violence: Not on file    Review of Systems  PHYSICAL EXAMINATION:    There were no vitals taken for this visit.    General appearance: alert, cooperative and appears stated age Head: Normocephalic, without obvious abnormality, atraumatic Neck: no adenopathy, supple, symmetrical, trachea midline and thyroid normal to inspection and palpation Lungs: clear to auscultation  bilaterally Breasts: normal appearance, no masses or tenderness, No nipple retraction or dimpling, No nipple discharge or bleeding, No axillary or supraclavicular adenopathy Heart: regular rate and rhythm Abdomen: soft, non-tender, no masses,  no organomegaly Extremities: extremities normal, atraumatic, no cyanosis or edema Skin: Skin color, texture, turgor normal. No rashes or lesions Lymph nodes: Cervical, supraclavicular, and axillary nodes normal. No abnormal inguinal nodes palpated Neurologic: Grossly normal  Pelvic: External genitalia:  no lesions              Urethra:  normal appearing urethra with no masses, tenderness or lesions              Bartholins and Skenes: normal                 Vagina: normal appearing vagina with normal color and discharge, no lesions              Cervix: no lesions                Bimanual Exam:  Uterus:  normal size, contour, position, consistency, mobility, non-tender              Adnexa: no mass, fullness, tenderness              Rectal exam: {yes no:[id number]}.  Confirms.              Anus:  normal sphincter tone, no lesions  Chaperone was present for exam:  ***  ASSESSMENT     PLAN     An After Visit Summary was printed and given to the patient.  ______ minutes face to face time of which over 50% was spent in counseling.

## 2022-03-28 ENCOUNTER — Ambulatory Visit: Payer: 59 | Admitting: Obstetrics and Gynecology

## 2022-03-29 NOTE — Telephone Encounter (Signed)
Encounter reviewed and closed.  

## 2022-03-29 NOTE — Telephone Encounter (Signed)
Jenny Howard read the My Chart message I sent her back on 03/01/22 because I was unable to reach her by phone. Last read by Stark Bray at 11:48 PM on 03/20/2022.  She has since r/s her IUD removal to 05/15/22 at 2:30pm.

## 2022-05-15 ENCOUNTER — Ambulatory Visit (INDEPENDENT_AMBULATORY_CARE_PROVIDER_SITE_OTHER): Payer: 59 | Admitting: Obstetrics and Gynecology

## 2022-05-15 ENCOUNTER — Encounter: Payer: Self-pay | Admitting: Obstetrics and Gynecology

## 2022-05-15 VITALS — BP 110/80 | Ht 64.0 in | Wt 175.0 lb

## 2022-05-15 DIAGNOSIS — N882 Stricture and stenosis of cervix uteri: Secondary | ICD-10-CM

## 2022-05-15 DIAGNOSIS — T8332XA Displacement of intrauterine contraceptive device, initial encounter: Secondary | ICD-10-CM

## 2022-05-15 NOTE — Progress Notes (Signed)
GYNECOLOGY  VISIT   HPI: 56 y.o.   Married  Caucasian  female   G1P1001 with No LMP recorded. (Menstrual status: IUD).   here for  IUD removal. Patient is menopausal and her IUD strings have not been visible.  She had an ultrasound which confirmed the normal IUD position on 10/09/19.  She does have some fibroids.   Not sleeping well.   Not having hot flashes.   Husband has rotating schedule.   GYNECOLOGIC HISTORY: No LMP recorded. (Menstrual status: IUD). Contraception:  iud Menopausal hormone therapy:  n/a Last mammogram:  02/01/2022 Bi-Rads cat 1 normal Last pap smear:   01/23/2022 NEG        OB History     Gravida  1   Para  1   Term  1   Preterm      AB      Living  1      SAB      IAB      Ectopic      Multiple      Live Births                 Patient Active Problem List   Diagnosis Date Noted   Special screening for malignant neoplasms, colon     Past Medical History:  Diagnosis Date   'light-for-dates' infant with signs of fetal malnutrition    'light-for-dates' infant with signs of fetal malnutrition    'light-for-dates' infant with signs of fetal malnutrition    'light-for-dates' infant with signs of fetal malnutrition    'light-for-dates' infant with signs of fetal malnutrition    'light-for-dates' infant with signs of fetal malnutrition    'light-for-dates' infant with signs of fetal malnutrition    'light-for-dates' infant with signs of fetal malnutrition    'light-for-dates' infant with signs of fetal malnutrition    COVID 08/2020   Medical history non-contributory     Past Surgical History:  Procedure Laterality Date   CESAREAN SECTION  1990   COLONOSCOPY N/A 12/04/2016   Procedure: COLONOSCOPY;  Surgeon: Danie Binder, MD;  [facility];  Service: Endoscopy;  Laterality: N/A;  11:15 AM   MASS EXCISION Left 07/22/2018   Procedure: EXCISION OF SUBMUSCULAR LEFT UPPER CHEST MASS;  Surgeon: Rolm Bookbinder, MD;   Location: [place];  Service: General;  Laterality: Left;    Current Outpatient Medications  Medication Sig Dispense Refill   lidocaine (LIDODERM) 5 % Lidoderm 5 % topical patch  APPLY 1 PATCH BY TOPICAL ROUTE ONCE DAILY (MAY WEAR UP TO 12HOURS.)     Multiple Vitamins-Minerals (ZINC PO) Take by mouth.     VITAMIN A PO Take by mouth.     No current facility-administered medications for this visit.     ALLERGIES: Methylprednisolone and Prednisone  Family History  Problem Relation Age of Onset   Thyroid disease Mother    Stroke Mother    Hypertension Father    Cancer Maternal Grandmother        Hx Thyroid Ca   Cancer Maternal Grandfather        unknown origin   Diabetes Paternal Grandmother    Hyperlipidemia Paternal Grandfather    Thyroid disease Maternal Aunt    Colon cancer Cousin    Breast cancer Maternal Aunt    Thyroid disease Maternal Aunt     Social History   Socioeconomic History   Marital status: Married    Spouse name: Not on file   Number  of children: Not on file   Years of education: Not on file   Highest education level: Not on file  Occupational History   Not on file  Tobacco Use   Smoking status: Never   Smokeless tobacco: Never  Vaping Use   Vaping Use: Never used  Substance and Sexual Activity   Alcohol use: Not Currently    Comment: 2-3 drinks/month   Drug use: No   Sexual activity: Yes    Partners: Male    Birth control/protection: I.U.D.    Comment: mirena IUD placed 08/09/17  Other Topics Concern   Not on file  Social History Narrative   Not on file   Social Determinants of Health   Financial Resource Strain: Not on file  Food Insecurity: Not on file  Transportation Needs: Not on file  Physical Activity: Not on file  Stress: Not on file  Social Connections: Not on file  Intimate Partner Violence: Not on file    Review of Systems  See HPI.  PHYSICAL EXAMINATION:    BP 110/80 (BP Location: Right Arm, Patient Position: Sitting,  Cuff Size: Normal)   Ht '5\' 4"'$  (1.626 m)   Wt 175 lb (79.4 kg)   SpO2 98%   BMI 30.04 kg/m     General appearance: alert, cooperative and appears stated age   Pelvic: External genitalia:  no lesions              Urethra:  normal appearing urethra with no masses, tenderness or lesions              Bartholins and Skenes: normal                 Vagina: normal appearing vagina with normal color and discharge, no lesions              Cervix: no lesions                Bimanual Exam:  Uterus:  normal size, contour, position, consistency, mobility, non-tender              Adnexa: no mass, fullness, tenderness          Attempted IUD removal.  Consent done.  Sterile prep of cervix with Hibiclens.  Cervix appears stenotic.  Both IUD strings are coming through the cervical stroma at 9:00, 7 mm lateral to the os.   Pap and os finder used and cervix is stenotic.  Procedure was abandoned.   Chaperone was present for exam:  Kimalexis, CMA  ASSESSMENT  Malpositioned IUD.  Cervical stenosis.   PLAN  We discussed her malpositioned IUD.  She will return for a pelvic US and possible IUD removal following this.  At a minimum, I expect a paracervical block and cervical dilation will be needed.  Hysteroscopy in the OR may also be needed for IUD removal.   An After Visit Summary was printed and given to the patient.  39 min  total time was spent for this patient encounter, including preparation, face-to-face counseling with the patient, coordination of care, and documentation of the encounter.

## 2022-05-17 NOTE — Progress Notes (Signed)
GYNECOLOGY  VISIT   HPI: 56 y.o.   Married  Caucasian  female   G1P1001 with No LMP recorded. (Menstrual status: IUD).   here for u/s & possible iud removal.    Her IUD strings were noted to extrude from cervical stroma lateral to her external cervical os with her recent office visit.  Her cervical os is stenotic.   Intolerant of injections and difficulty with procedures.   Feels irritable and not sleeping well.   GYNECOLOGIC HISTORY: No LMP recorded. (Menstrual status: IUD). Contraception:  iud placed 2018 Menopausal hormone therapy:  none Last mammogram:  01-23-22 category b density birads 1:neg Last pap smear:   01-23-22 neg HPV HR neg        OB History     Gravida  1   Para  1   Term  1   Preterm      AB      Living  1      SAB      IAB      Ectopic      Multiple      Live Births                 Patient Active Problem List   Diagnosis Date Noted   Special screening for malignant neoplasms, colon     Past Medical History:  Diagnosis Date   'light-for-dates' infant with signs of fetal malnutrition    'light-for-dates' infant with signs of fetal malnutrition    'light-for-dates' infant with signs of fetal malnutrition    'light-for-dates' infant with signs of fetal malnutrition    'light-for-dates' infant with signs of fetal malnutrition    'light-for-dates' infant with signs of fetal malnutrition    'light-for-dates' infant with signs of fetal malnutrition    'light-for-dates' infant with signs of fetal malnutrition    'light-for-dates' infant with signs of fetal malnutrition    COVID 08/2020   Medical history non-contributory     Past Surgical History:  Procedure Laterality Date   CESAREAN SECTION  1990   COLONOSCOPY N/A 12/04/2016   Procedure: COLONOSCOPY;  Surgeon: Danie Binder, MD;  [facility];  Service: Endoscopy;  Laterality: N/A;  11:15 AM   MASS EXCISION Left 07/22/2018   Procedure: EXCISION OF SUBMUSCULAR LEFT UPPER  CHEST MASS;  Surgeon: Rolm Bookbinder, MD;  Location: [place];  Service: General;  Laterality: Left;    Current Outpatient Medications  Medication Sig Dispense Refill   Ascorbic Acid (VITAMIN C PO) Take by mouth.     BIOTIN PO Take by mouth.     misoprostol (CYTOTEC) 200 MCG tablet Place one tablet (200 mcg)  in the vaginal the night before the procedure and place one tablet in the vagina the morning of the procedure. 2 tablet 0   Multiple Vitamins-Minerals (ZINC PO) Take by mouth.     VITAMIN A PO Take by mouth.     VITAMIN D PO Take by mouth.     No current facility-administered medications for this visit.     ALLERGIES: Methylprednisolone and Prednisone  Family History  Problem Relation Age of Onset   Thyroid disease Mother    Stroke Mother    Hypertension Father    Cancer Maternal Grandmother        Hx Thyroid Ca   Cancer Maternal Grandfather        unknown origin   Diabetes Paternal Grandmother    Hyperlipidemia Paternal Grandfather    Thyroid disease  Maternal Aunt    Colon cancer Cousin    Breast cancer Maternal Aunt    Thyroid disease Maternal Aunt     Social History   Socioeconomic History   Marital status: Married    Spouse name: Not on file   Number of children: Not on file   Years of education: Not on file   Highest education level: Not on file  Occupational History   Not on file  Tobacco Use   Smoking status: Never   Smokeless tobacco: Never  Vaping Use   Vaping Use: Never used  Substance and Sexual Activity   Alcohol use: Not Currently    Comment: 2-3 drinks/month   Drug use: No   Sexual activity: Yes    Partners: Male    Birth control/protection: I.U.D.    Comment: mirena IUD placed 08/09/17  Other Topics Concern   Not on file  Social History Narrative   Not on file   Social Determinants of Health   Financial Resource Strain: Not on file  Food Insecurity: Not on file  Transportation Needs: Not on file  Physical Activity: Not on file   Stress: Not on file  Social Connections: Not on file  Intimate Partner Violence: Not on file    Review of Systems  Constitutional: Negative.   HENT: Negative.    Eyes: Negative.   Respiratory: Negative.    Cardiovascular: Negative.   Gastrointestinal: Negative.   Endocrine: Negative.   Genitourinary: Negative.   Musculoskeletal: Negative.   Skin: Negative.   Allergic/Immunologic: Negative.   Neurological: Negative.   Hematological: Negative.   Psychiatric/Behavioral: Negative.      PHYSICAL EXAMINATION:    BP 118/80   Pulse 76   SpO2 99%     General appearance: alert, cooperative and appears stated age Lungs:  CTA bilaterally  Cor:  S1S2 RRR.  Pelvic US Uterus 7.72 x 4.58 x 3.46 cm. Fibroid 1.64 cm. Cystica space in the C/S scar 1.0 x 0.6 cm. IUD in normal position in endometrial canal.  No endometrial mass of thickening.  IUD strings take a course somewhat difficult to follow at the level of the external os.  It seems that a portion of cervix has grown over the strings.  Left ovary 3.16 x 2.04 x 1.63 cm.  Normal.  Right ovary 2.71 x 1.57 x 1.79 cm.  Normal.   ASSESSMENT  Malpositioned IUD.  Cervical stenosis.   PLAN  US findings reviewed with the patient.    Options of paracervical block in the office with cervical dilation and IUD removal versus outpatient hysteroscopic IUD removal with cervical dilation discussed.  Will proceed with cervical dilation and hysteroscopic removal of the IUD at [facility].  Risks include but are not limited to bleeding, infection, damage to surrounding organs including uterine perforation requiring hospitalization and laparoscopy, pulmonary edema, reaction to anesthesia, DVT, PE, death, need for further treatment and surgery. Surgical expectations and recovery discussed.  Patient wishes to proceed. [facility] brochure on hysteroscopy to patient.   Plan for Cytotec 200 mcg pv the night before hysteroscopic IUD removal  and then 200 mcg pv the morning of the IUD removal.   An After Visit Summary was printed and given to the patient.

## 2022-05-18 ENCOUNTER — Encounter: Payer: Self-pay | Admitting: Obstetrics and Gynecology

## 2022-05-18 ENCOUNTER — Ambulatory Visit (INDEPENDENT_AMBULATORY_CARE_PROVIDER_SITE_OTHER): Payer: 59 | Admitting: Obstetrics and Gynecology

## 2022-05-18 ENCOUNTER — Ambulatory Visit (INDEPENDENT_AMBULATORY_CARE_PROVIDER_SITE_OTHER): Payer: 59

## 2022-05-18 ENCOUNTER — Other Ambulatory Visit: Payer: Self-pay | Admitting: Obstetrics and Gynecology

## 2022-05-18 VITALS — BP 118/80 | HR 76

## 2022-05-18 DIAGNOSIS — N882 Stricture and stenosis of cervix uteri: Secondary | ICD-10-CM

## 2022-05-18 DIAGNOSIS — T8332XA Displacement of intrauterine contraceptive device, initial encounter: Secondary | ICD-10-CM

## 2022-05-18 DIAGNOSIS — T8332XD Displacement of intrauterine contraceptive device, subsequent encounter: Secondary | ICD-10-CM

## 2022-05-18 MED ORDER — MISOPROSTOL 200 MCG PO TABS: ORAL_TABLET | ORAL | 0 refills | Status: DC

## 2022-05-20 ENCOUNTER — Telehealth: Payer: Self-pay | Admitting: Obstetrics and Gynecology

## 2022-05-20 DIAGNOSIS — T8332XA Displacement of intrauterine contraceptive device, initial encounter: Secondary | ICD-10-CM | POA: Insufficient documentation

## 2022-05-20 DIAGNOSIS — N882 Stricture and stenosis of cervix uteri: Secondary | ICD-10-CM | POA: Insufficient documentation

## 2022-05-20 NOTE — Telephone Encounter (Signed)
Please precert and schedule surgery for patient.  She needs a hysteroscopic IUD removal with dilation of the cervix at [facility].   Her diagnoses are:  malpositioned IUD and cervical stenosis.   Time needed is 45 minutes.   No preop needed.  I sent a prescription already to her pharmacy for Cytotec and instructed her in use.

## 2022-05-24 NOTE — Telephone Encounter (Signed)
Left message to call Diedra Sinor, RN at [facility], [phone].  

## 2022-06-02 NOTE — Telephone Encounter (Signed)
Spoke with patient. States she is currently out of town. States she will return call on Monday to further discuss.

## 2022-06-02 NOTE — Telephone Encounter (Signed)
Left message to call Beverly Ferner, RN at [facility], [phone].  

## 2022-06-05 NOTE — Telephone Encounter (Signed)
Spoke with patient. Reviewed surgery dates. Patient request to proceed with surgery on 07/17/22. Patient declines earlier dates.   Advised patient I will forward to business office for return call. I will return call once surgery date and time confirmed. Patient verbalizes understanding and is agreeable.   Surgery request sent.   Routing to [employer] for benefits

## 2022-06-05 NOTE — Telephone Encounter (Signed)
Call returned to patient.  Advised provider out of office 12/4, reviewed alternative dates. Patient request to proceed with scheduling on 07/11/22.   Spoke with Scientist, clinical (histocompatibility and immunogenetics) in [employer], case moved to 07/11/22.

## 2022-06-05 NOTE — Telephone Encounter (Signed)
Left message to call Valkyrie Guardiola, RN at [facility], [phone].  

## 2022-06-06 NOTE — Telephone Encounter (Signed)
Spoke with patient regarding surgery benefits. Patient acknowledges understanding of information presented. Patient is aware that benefits presented are professional benefits only. Patient is aware the hospital will call with facility benefits. See account note.Front desk aware of PR  Routing to [employer], Therapist, sports.

## 2022-06-07 NOTE — Telephone Encounter (Signed)
Left message to call Joselin Crandell, RN at [facility], [phone].  

## 2022-06-13 NOTE — Telephone Encounter (Signed)
Spoke with patient. Surgery date request confirmed.  Advised surgery is scheduled for 07/11/22, [facility] at 1040.  Surgery instruction sheet and hospital brochure reviewed, printed copy will be mailed.  Patient verbalizes understanding and is agreeable.  Routing to provider. Encounter closed.

## 2022-07-04 ENCOUNTER — Telehealth: Payer: Self-pay | Admitting: *Deleted

## 2022-07-04 DIAGNOSIS — N882 Stricture and stenosis of cervix uteri: Secondary | ICD-10-CM

## 2022-07-04 DIAGNOSIS — Z30432 Encounter for removal of intrauterine contraceptive device: Secondary | ICD-10-CM

## 2022-07-04 DIAGNOSIS — T8332XD Displacement of intrauterine contraceptive device, subsequent encounter: Secondary | ICD-10-CM

## 2022-07-04 NOTE — Telephone Encounter (Signed)
Spoke with patient, advised per Dr. Quincy Simmonds.  Patient request to cancel surgery and schedule IUD removal in office.   Surgery cancelled. Scheduled in office for 11/28 at 11am. Patient already has Rx for Cytotec. Advised to take Motrin 800 mg with food and water one hour before procedure.Patient verbalizes understanding and is agreeable.   Business office notified. New order placed.   Routing to provider for final review. Patient is agreeable to disposition. Will close encounter.

## 2022-07-04 NOTE — Telephone Encounter (Signed)
Ok to cancel removal of the IUD in the OR and attempt in office removal.   I do recommend she use the Cytotec 200 mcg in the vagina the night before the procedure and then the am of the procedure on the day of her office visit.   I will plan to do a paracervical block for her.  I will likely need to dilate her cervix to remove the IUD.

## 2022-07-04 NOTE — Progress Notes (Signed)
GYNECOLOGY  VISIT   HPI: 56 y.o.   Married  Caucasian  female   G1P1001 with No LMP recorded. (Menstrual status: IUD).   here for IUD removal. She has cervical stenosis and the IUD strings are noted to be to the right of the perceived exocervical opening.  Her pelvic US shows her IUD to be in a normal position.  She previously declined removal of the IUD in the office setting, and has not accepted this option.   [place] 67.3 on 01/23/22.  Used Cytotec vaginally and took Tylenol.   UPT negative today.  GYNECOLOGIC HISTORY: No LMP recorded. (Menstrual status: IUD). Contraception:  IUD Menopausal hormone therapy:  n/a Last mammogram:  02/01/22, Breast Composition Category B, BI-RADS CATEGORY 1 Negative Last pap smear:   01/23/22 negative, HR HPV negative,  11-02-16 Neg:Neg HR HPV         OB History     Gravida  1   Para  1   Term  1   Preterm      AB      Living  1      SAB      IAB      Ectopic      Multiple      Live Births                 Patient Active Problem List   Diagnosis Date Noted   Malpositioned intrauterine device 05/20/2022   Cervical stenosis (uterine cervix) 05/20/2022   Special screening for malignant neoplasms, colon     Past Medical History:  Diagnosis Date   'light-for-dates' infant with signs of fetal malnutrition    'light-for-dates' infant with signs of fetal malnutrition    'light-for-dates' infant with signs of fetal malnutrition    'light-for-dates' infant with signs of fetal malnutrition    'light-for-dates' infant with signs of fetal malnutrition    'light-for-dates' infant with signs of fetal malnutrition    'light-for-dates' infant with signs of fetal malnutrition    'light-for-dates' infant with signs of fetal malnutrition    'light-for-dates' infant with signs of fetal malnutrition    COVID 08/2020   Medical history non-contributory     Past Surgical History:  Procedure Laterality Date   CESAREAN SECTION  1990    COLONOSCOPY N/A 12/04/2016   Procedure: COLONOSCOPY;  Surgeon: Danie Binder, MD;  [facility];  Service: Endoscopy;  Laterality: N/A;  11:15 AM   MASS EXCISION Left 07/22/2018   Procedure: EXCISION OF SUBMUSCULAR LEFT UPPER CHEST MASS;  Surgeon: Rolm Bookbinder, MD;  Location: [place];  Service: General;  Laterality: Left;    Current Outpatient Medications  Medication Sig Dispense Refill   Ascorbic Acid (VITAMIN C PO) Take by mouth.     BIOTIN PO Take by mouth.     misoprostol (CYTOTEC) 200 MCG tablet Place one tablet (200 mcg)  in the vaginal the night before the procedure and place one tablet in the vagina the morning of the procedure. 2 tablet 0   Multiple Vitamins-Minerals (ZINC PO) Take by mouth.     VITAMIN A PO Take by mouth.     VITAMIN D PO Take by mouth.     No current facility-administered medications for this visit.     ALLERGIES: Methylprednisolone and Prednisone  Family History  Problem Relation Age of Onset   Thyroid disease Mother    Stroke Mother    Hypertension Father    Cancer Maternal Grandmother  Hx Thyroid Ca   Cancer Maternal Grandfather        unknown origin   Diabetes Paternal Grandmother    Hyperlipidemia Paternal Grandfather    Thyroid disease Maternal Aunt    Colon cancer Cousin    Breast cancer Maternal Aunt    Thyroid disease Maternal Aunt     Social History   Socioeconomic History   Marital status: Married    Spouse name: Not on file   Number of children: Not on file   Years of education: Not on file   Highest education level: Not on file  Occupational History   Not on file  Tobacco Use   Smoking status: Never   Smokeless tobacco: Never  Vaping Use   Vaping Use: Never used  Substance and Sexual Activity   Alcohol use: Not Currently    Comment: 2-3 drinks/month   Drug use: No   Sexual activity: Yes    Partners: Male    Birth control/protection: I.U.D.    Comment: mirena IUD placed 08/09/17  Other Topics  Concern   Not on file  Social History Narrative   Not on file   Social Determinants of Health   Financial Resource Strain: Not on file  Food Insecurity: Not on file  Transportation Needs: Not on file  Physical Activity: Not on file  Stress: Not on file  Social Connections: Not on file  Intimate Partner Violence: Not on file    Review of Systems  All other systems reviewed and are negative.   PHYSICAL EXAMINATION:    BP 117/77 (BP Location: Right Arm, Patient Position: Sitting, Cuff Size: Normal)   Ht 5' 3.5" (1.613 m)   Wt 177 lb (80.3 kg)   BMI 30.86 kg/m     General appearance: alert, cooperative and appears stated age   IUD removal.  Consent done.  Hibiclens prep.  Local 1% lidocaine paracervical block, lot [id number], exp 09/15/23.  Tenaculum to anterior cervical lip and then to the posterior cervical lip.  IUD string coming out of cervix at 9:00 position lateral to closed os.  Scalpel used to open the exocervical canal.  Os finder and small dilators used to dilate the cervix.  Ring forceps used and IUD successfully removed with the IUD intact, shown to patient, and discarded.  Pelvic: External genitalia:  no lesions              Urethra:  normal appearing urethra with no masses, tenderness or lesions              Bartholins and Skenes: normal                 Vagina: normal appearing vagina with normal color and discharge, no lesions              Cervix: no lesions                Bimanual Exam:  Uterus:  normal size, contour, position, consistency, mobility, non-tender              Adnexa: no mass, fullness, tenderness            Chaperone was present for exam: Raquel Sarna  ASSESSMENT  Malpositioned IUD due to cervical stenosis.  Encounter for IUD removal.    PLAN  Nothing per vagina for 2 weeks.  Call for heavy vaginal bleeding, pain or fever.  FU for annual exam in June, 2024 and prn.    An After Visit Summary was printed  and given to the patient.

## 2022-07-04 NOTE — Telephone Encounter (Signed)
Spoke with patient.  Patient has received her OOP cost from hospital.  Requesting to reschedule IUD in office vs OR. Patient requesting to attempt removal in office setting with "numbing". Patient is currently scheduled for IUD removal at [facility] on 11/28.  Advised patient I will review with Dr. Quincy Simmonds and return call.  Dr. Quincy Simmonds -please review and advise.

## 2022-07-11 ENCOUNTER — Encounter: Payer: Self-pay | Admitting: Obstetrics and Gynecology

## 2022-07-11 ENCOUNTER — Ambulatory Visit (INDEPENDENT_AMBULATORY_CARE_PROVIDER_SITE_OTHER): Payer: 59 | Admitting: Obstetrics and Gynecology

## 2022-07-11 ENCOUNTER — Ambulatory Visit (HOSPITAL_BASED_OUTPATIENT_CLINIC_OR_DEPARTMENT_OTHER): Admission: RE | Admit: 2022-07-11 | Payer: 59 | Source: Home / Self Care | Admitting: Obstetrics and Gynecology

## 2022-07-11 VITALS — BP 117/77 | Ht 63.5 in | Wt 177.0 lb

## 2022-07-11 DIAGNOSIS — T8332XD Displacement of intrauterine contraceptive device, subsequent encounter: Secondary | ICD-10-CM

## 2022-07-11 DIAGNOSIS — Z30432 Encounter for removal of intrauterine contraceptive device: Secondary | ICD-10-CM

## 2022-07-11 DIAGNOSIS — Z01812 Encounter for preprocedural laboratory examination: Secondary | ICD-10-CM

## 2022-07-11 DIAGNOSIS — N882 Stricture and stenosis of cervix uteri: Secondary | ICD-10-CM

## 2022-07-11 LAB — PREGNANCY, URINE: Preg Test, Ur: NEGATIVE

## 2022-07-11 SURGERY — HYSTEROSCOPY
Anesthesia: Choice

## 2022-07-26 ENCOUNTER — Encounter: Payer: 59 | Admitting: Obstetrics and Gynecology

## 2022-11-30 NOTE — Progress Notes (Signed)
57 y.o. G29P1001 Married Caucasian female here for annual exam.    Had light bleeding for 3 - 4 days in April, 2024.  Some cramping.  Felt like a period.  IUD removed in office 07/11/22.   FSH 67.3 and estradiol < 15 on 01/23/22.   Caring for family members.   PCP:   Dr. Phillips Odor.   No LMP recorded. Patient is perimenopausal.    Pt had discharge second week of April and a little bit of spotting       Sexually active: Yes.    The current method of family planning is perimenopause.    Exercising: Yes.     Walking and gym sometimes Smoker:  no  Health Maintenance: Pap:  01/23/22 negative, HR HPV negative,  11-02-16 Neg:Neg HR HPV  History of abnormal Pap:  no MMG:  02/01/22, Breast Composition Category B, BI-RADS CATEGORY 1 Negative  Colonoscopy:  12/04/16 BMD:   n/a  Result  n/a TDaP:  2017 Gardasil:   no HIV: neg in PCP Hep C: neg in PCP Screening Labs:  PCP   reports that she has never smoked. She has never used smokeless tobacco. She reports current alcohol use. She reports that she does not use drugs.  Past Medical History:  Diagnosis Date   'Light-for-dates' infant with signs of fetal malnutrition    'Light-for-dates' infant with signs of fetal malnutrition    'Light-for-dates' infant with signs of fetal malnutrition    'Light-for-dates' infant with signs of fetal malnutrition    'Light-for-dates' infant with signs of fetal malnutrition    'Light-for-dates' infant with signs of fetal malnutrition    'Light-for-dates' infant with signs of fetal malnutrition    'Light-for-dates' infant with signs of fetal malnutrition    'Light-for-dates' infant with signs of fetal malnutrition    COVID 08/2020   Medical history non-contributory     Past Surgical History:  Procedure Laterality Date   CESAREAN SECTION  1990   COLONOSCOPY N/A 12/04/2016   Procedure: COLONOSCOPY;  Surgeon: West Bali, MD;  [facility];  Service: Endoscopy;  Laterality: N/A;  11:15 AM    MASS EXCISION Left 07/22/2018   Procedure: EXCISION OF SUBMUSCULAR LEFT UPPER CHEST MASS;  Surgeon: Emelia Loron, MD;  Location: [facility];  Service: General;  Laterality: Left;    Current Outpatient Medications  Medication Sig Dispense Refill   Ascorbic Acid (VITAMIN C PO) Take by mouth.     BIOTIN PO Take by mouth.     Multiple Vitamins-Minerals (ZINC PO) Take by mouth.     VITAMIN A PO Take by mouth.     VITAMIN D PO Take by mouth.     No current facility-administered medications for this visit.    Family History  Problem Relation Age of Onset   Thyroid disease Mother    Stroke Mother    Hypertension Father    Cancer Maternal Grandmother        Hx Thyroid Ca   Cancer Maternal Grandfather        unknown origin   Diabetes Paternal Grandmother    Hyperlipidemia Paternal Grandfather    Thyroid disease Maternal Aunt    Colon cancer Cousin    Breast cancer Maternal Aunt    Thyroid disease Maternal Aunt     Review of Systems  All other systems reviewed and are negative.   Exam:   BP 118/78 (BP Location: Left Arm, Patient Position: Sitting, Cuff Size: Normal)   Pulse 81  Ht 5\' 4"  (1.626 m)   Wt 166 lb (75.3 kg)   SpO2 98%   BMI 28.49 kg/m     General appearance: alert, cooperative and appears stated age Head: normocephalic, without obvious abnormality, atraumatic Neck: no adenopathy, supple, symmetrical, trachea midline and thyroid normal to inspection and palpation Lungs: clear to auscultation bilaterally Breasts: normal appearance, no masses or tenderness, No nipple retraction or dimpling, No nipple discharge or bleeding, No axillary adenopathy Heart: regular rate and rhythm Abdomen: soft, non-tender; no masses, no organomegaly Extremities: extremities normal, atraumatic, no cyanosis or edema Skin: skin color, texture, turgor normal. No rashes or lesions Lymph nodes: cervical, supraclavicular, and axillary nodes normal. Neurologic: grossly normal  Pelvic:  External genitalia:  no lesions              No abnormal inguinal nodes palpated.              Urethra:  normal appearing urethra with no masses, tenderness or lesions              Bartholins and Skenes: normal                 Vagina: normal appearing vagina with normal color and discharge, no lesions              Cervix: no lesions              Pap taken: yes Bimanual Exam:  Uterus:  normal size, contour, position, consistency, mobility, non-tender              Adnexa: no mass, fullness, tenderness              Rectal exam: yes.  Confirms.              Anus:  normal sphincter tone, no lesions  Chaperone was present for exam:  Irving Burton.  Assessment:   Well woman visit with gynecologic exam. Hx stenotic cervix.  Fibroids.  Postmenopausal bleeding.  Status post lipoma excision of chest.    Plan: Mammogram screening discussed. Self breast awareness reviewed. Pap and HR HPV collected. Guidelines for Calcium, Vitamin D, regular exercise program including cardiovascular and weight bearing exercise. FSH and estradiol.  If menopausal by labs, will need to return for pelvic US and EMB.  Patient understands that the importance to rule out precancer or cancer.   Follow up annually and prn.   After visit summary provided.

## 2022-12-14 ENCOUNTER — Other Ambulatory Visit (HOSPITAL_COMMUNITY)
Admission: RE | Admit: 2022-12-14 | Discharge: 2022-12-14 | Disposition: A | Discharge status: Home / Self Care | Payer: 59 | Source: Ambulatory Visit | Attending: Obstetrics and Gynecology | Admitting: Obstetrics and Gynecology

## 2022-12-14 ENCOUNTER — Ambulatory Visit (INDEPENDENT_AMBULATORY_CARE_PROVIDER_SITE_OTHER): Payer: 59 | Admitting: Obstetrics and Gynecology

## 2022-12-14 ENCOUNTER — Encounter: Payer: Self-pay | Admitting: Obstetrics and Gynecology

## 2022-12-14 VITALS — BP 118/78 | HR 81 | Ht 64.0 in | Wt 166.0 lb

## 2022-12-14 DIAGNOSIS — Z124 Encounter for screening for malignant neoplasm of cervix: Secondary | ICD-10-CM

## 2022-12-14 DIAGNOSIS — Z01419 Encounter for gynecological examination (general) (routine) without abnormal findings: Secondary | ICD-10-CM

## 2022-12-14 DIAGNOSIS — N950 Postmenopausal bleeding: Secondary | ICD-10-CM

## 2022-12-14 NOTE — Patient Instructions (Signed)

## 2022-12-15 ENCOUNTER — Encounter: Payer: Self-pay | Admitting: Obstetrics and Gynecology

## 2022-12-15 LAB — FOLLICLE STIMULATING HORMONE: FSH: 73 m[IU]/mL

## 2022-12-15 LAB — ESTRADIOL: Estradiol: <15 pg/mL

## 2022-12-18 ENCOUNTER — Other Ambulatory Visit: Payer: Self-pay | Admitting: Obstetrics and Gynecology

## 2022-12-18 DIAGNOSIS — N950 Postmenopausal bleeding: Secondary | ICD-10-CM

## 2022-12-19 ENCOUNTER — Other Ambulatory Visit: Payer: Self-pay | Admitting: Obstetrics and Gynecology

## 2022-12-19 LAB — CYTOLOGY - PAP
Comment: NEGATIVE
Diagnosis: NEGATIVE
High risk HPV: NEGATIVE

## 2022-12-19 NOTE — Progress Notes (Signed)
Please see result note from 12/18/22.

## 2022-12-28 ENCOUNTER — Telehealth: Payer: Self-pay | Admitting: Obstetrics and Gynecology

## 2022-12-28 NOTE — Telephone Encounter (Signed)
LVMTCB

## 2022-12-28 NOTE — Telephone Encounter (Signed)
Please contact patient in follow up to result note 12/18/22.   Her hormone levels indicates she is postmenopausal.  I do recommend proceeding with a pelvic ultrasound and endometrial biopsy to evaluate her postmenopausal bleeding. Patient is aware to expect this recommendation.

## 2023-01-15 NOTE — Telephone Encounter (Signed)
Refer to result note: letter mailed to pt today in f/u to Dr. Rica Records recommendations.

## 2023-01-22 NOTE — Telephone Encounter (Signed)
Encounter reviewed and closed.  

## 2023-03-30 NOTE — Addendum Note (Signed)
Addended by: Leda Min on: 03/30/2023 12:19 PM   Modules accepted: Orders

## 2023-09-03 ENCOUNTER — Emergency Department (HOSPITAL_COMMUNITY): Payer: 59

## 2023-09-03 ENCOUNTER — Other Ambulatory Visit: Payer: Self-pay

## 2023-09-03 ENCOUNTER — Encounter (HOSPITAL_COMMUNITY): Payer: Self-pay | Admitting: Emergency Medicine

## 2023-09-03 ENCOUNTER — Emergency Department (HOSPITAL_COMMUNITY)
Admission: EM | Admit: 2023-09-03 | Discharge: 2023-09-04 | Disposition: A | Discharge status: Home / Self Care | Payer: 59 | Attending: Emergency Medicine | Admitting: Emergency Medicine

## 2023-09-03 DIAGNOSIS — R1032 Left lower quadrant pain: Secondary | ICD-10-CM | POA: Insufficient documentation

## 2023-09-03 DIAGNOSIS — N83201 Unspecified ovarian cyst, right side: Secondary | ICD-10-CM

## 2023-09-03 DIAGNOSIS — N390 Urinary tract infection, site not specified: Secondary | ICD-10-CM

## 2023-09-03 LAB — BASIC METABOLIC PANEL
Anion gap: 13 (ref 5–15)
BUN: 16 mg/dL (ref 6–20)
CO2: 22 mmol/L (ref 22–32)
Calcium: 9.5 mg/dL (ref 8.9–10.3)
Chloride: 104 mmol/L (ref 98–111)
Creatinine, Ser: 0.78 mg/dL (ref 0.44–1.00)
GFR, Estimated: >60 mL/min (ref >60)
Glucose, Bld: 113 mg/dL — ABNORMAL HIGH (ref 70–99)
Potassium: 3.2 mmol/L — ABNORMAL LOW (ref 3.5–5.1)
Sodium: 139 mmol/L (ref 135–145)

## 2023-09-03 LAB — URINALYSIS, ROUTINE W REFLEX MICROSCOPIC
Bilirubin Urine: NEGATIVE
Glucose, UA: NEGATIVE mg/dL
Hgb urine dipstick: NEGATIVE
Ketones, ur: NEGATIVE mg/dL
Nitrite: NEGATIVE
Protein, ur: 30 mg/dL — AB
Specific Gravity, Urine: 1.019 (ref 1.005–1.030)
pH: 8 (ref 5.0–8.0)

## 2023-09-03 LAB — CBC WITH DIFFERENTIAL/PLATELET
Abs Immature Granulocytes: 0.01 10*3/uL (ref 0.00–0.07)
Basophils Absolute: 0 10*3/uL (ref 0.0–0.1)
Basophils Relative: 1 %
Eosinophils Absolute: 0.1 10*3/uL (ref 0.0–0.5)
Eosinophils Relative: 2 %
HCT: 38.8 % (ref 36.0–46.0)
Hemoglobin: 13.4 g/dL (ref 12.0–15.0)
Immature Granulocytes: 0 %
Lymphocytes Relative: 29 %
Lymphs Abs: 1.4 10*3/uL (ref 0.7–4.0)
MCH: 30.4 pg (ref 26.0–34.0)
MCHC: 34.5 g/dL (ref 30.0–36.0)
MCV: 88 fL (ref 80.0–100.0)
Monocytes Absolute: 0.4 10*3/uL (ref 0.1–1.0)
Monocytes Relative: 7 %
Neutro Abs: 2.9 10*3/uL (ref 1.7–7.7)
Neutrophils Relative %: 61 %
Platelets: 197 10*3/uL (ref 150–400)
RBC: 4.41 MIL/uL (ref 3.87–5.11)
RDW: 12 % (ref 11.5–15.5)
WBC: 4.8 10*3/uL (ref 4.0–10.5)
nRBC: 0 % (ref 0.0–0.2)

## 2023-09-03 MED ORDER — METOCLOPRAMIDE HCL 5 MG/ML IJ SOLN
10.0000 mg | Freq: Once | INTRAMUSCULAR | Status: AC
  Administered 2023-09-03: 10 mg via INTRAVENOUS
  Filled 2023-09-03: qty 2

## 2023-09-03 MED ORDER — ONDANSETRON HCL 4 MG/2ML IJ SOLN
4.0000 mg | Freq: Once | INTRAMUSCULAR | Status: AC
  Administered 2023-09-03: 4 mg via INTRAVENOUS

## 2023-09-03 MED ORDER — SODIUM CHLORIDE 0.9 % IV BOLUS
1000.0000 mL | Freq: Once | INTRAVENOUS | Status: AC
  Administered 2023-09-03: 1000 mL via INTRAVENOUS

## 2023-09-03 MED ORDER — KETOROLAC TROMETHAMINE 30 MG/ML IJ SOLN
15.0000 mg | Freq: Once | INTRAMUSCULAR | Status: AC
  Administered 2023-09-03: 15 mg via INTRAVENOUS
  Filled 2023-09-03: qty 1

## 2023-09-03 MED ORDER — HYDROMORPHONE HCL 1 MG/ML IJ SOLN
1.0000 mg | Freq: Once | INTRAMUSCULAR | Status: AC
  Administered 2023-09-03: 1 mg via INTRAVENOUS
  Filled 2023-09-03: qty 1

## 2023-09-03 MED ORDER — SODIUM CHLORIDE 0.9 % IV SOLN
1.0000 g | Freq: Once | INTRAVENOUS | Status: AC
  Administered 2023-09-03: 1 g via INTRAVENOUS
  Filled 2023-09-03: qty 10

## 2023-09-03 NOTE — ED Notes (Signed)
Meds not showing in pixis. Pharmacy gave verbal to override medications.

## 2023-09-03 NOTE — ED Provider Notes (Signed)
[facility] AT [facility] Provider Note   CSN: [id number] Arrival date & time: 09/03/23  1641     History  Chief Complaint  Patient presents with   Flank Pain    left    ERIOLUWA VENTURINO is a 58 y.o. female with no significant past medical history presenting with sudden onset left flank pain which started around 11 AM today.  She describes constant pain which has not moved and is now in her left lower abdomen region.  She endorses nausea and vomiting.  She denies any urinary symptoms including dysuria or hematuria.  She took 2 Tylenol prior to arrival and also placed a lidocaine patch on her back without any relief of her symptoms.  She denies history of kidney stones.  No fevers or chills, no other complaints.  The history is provided by the patient.       Home Medications Prior to Admission medications   Medication Sig Start Date End Date Taking? Authorizing Provider  Ascorbic Acid (VITAMIN C PO) Take by mouth.   Yes [provider]  BIOTIN PO Take by mouth.   Yes [provider]  Multiple Vitamins-Minerals (ZINC PO) Take by mouth.   Yes [provider]  VITAMIN A PO Take by mouth.   Yes [provider]  VITAMIN D PO Take by mouth.   Yes [provider]  azithromycin (ZITHROMAX) 250 MG tablet Take 250 mg by mouth daily. Dose pack    [provider]      Allergies    Methylprednisolone and Prednisone    Review of Systems   Review of Systems  Constitutional:  Negative for chills and fever.  HENT:  Negative for congestion and sore throat.   Eyes: Negative.   Respiratory:  Negative for chest tightness and shortness of breath.   Cardiovascular:  Negative for chest pain.  Gastrointestinal:  Positive for nausea and vomiting. Negative for abdominal pain.  Genitourinary:  Positive for flank pain. Negative for dysuria, hematuria and urgency.  Musculoskeletal:  Negative for arthralgias, joint  swelling and neck pain.  Skin: Negative.  Negative for rash and wound.  Neurological:  Negative for dizziness, weakness, light-headedness, numbness and headaches.  Psychiatric/Behavioral: Negative.    All other systems reviewed and are negative.   Physical Exam Updated Vital Signs BP 131/80   Pulse 77   Temp 97.6 F (36.4 C) (Oral)   Ht 5\' 4"  (1.626 m)   Wt 79.4 kg   SpO2 97%   BMI 30.04 kg/m  Physical Exam Vitals and nursing note reviewed.  Constitutional:      General: She is in acute distress.     Appearance: She is well-developed.     Comments: Can't sit still, obvious discomfort.  HENT:     Head: Normocephalic and atraumatic.  Eyes:     Conjunctiva/sclera: Conjunctivae normal.  Cardiovascular:     Rate and Rhythm: Normal rate and regular rhythm.     Heart sounds: Normal heart sounds.  Pulmonary:     Effort: Pulmonary effort is normal.     Breath sounds: Normal breath sounds. No wheezing.  Abdominal:     General: Bowel sounds are normal.     Palpations: Abdomen is soft.     Tenderness: There is abdominal tenderness in the left lower quadrant. There is no left CVA tenderness, guarding or rebound.  Musculoskeletal:        General: Normal range of motion.  Cervical back: Normal range of motion.  Skin:    General: Skin is warm and dry.  Neurological:     Mental Status: She is alert.     ED Results / Procedures / Treatments   Labs (all labs ordered are listed, but only abnormal results are displayed) Labs Reviewed  BASIC METABOLIC PANEL - Abnormal; Notable for the following components:      Result Value   Potassium 3.2 (*)    Glucose, Bld 113 (*)    All other components within normal limits  URINALYSIS, ROUTINE W REFLEX MICROSCOPIC - Abnormal; Notable for the following components:   APPearance CLOUDY (*)    Protein, ur 30 (*)    Leukocytes,Ua TRACE (*)    Bacteria, UA MANY (*)    All other components within normal limits  CBC WITH DIFFERENTIAL/PLATELET     EKG None  Radiology CT Renal Stone Study Result Date: 09/03/2023 CLINICAL DATA:  Left flank, low back pain EXAM: CT ABDOMEN AND PELVIS WITHOUT CONTRAST TECHNIQUE: Multidetector CT imaging of the abdomen and pelvis was performed following the standard protocol without IV contrast. RADIATION DOSE REDUCTION: This exam was performed according to the departmental dose-optimization program which includes automated exposure control, adjustment of the mA and/or kV according to patient size and/or use of iterative reconstruction technique. COMPARISON:  None Available. FINDINGS: Lower chest: No acute abnormality Hepatobiliary: No suspicious focal hepatic abnormality. 9 mm hypodensity in the left hepatic lobe most compatible with cyst. Gallbladder unremarkable. Pancreas: No focal abnormality or ductal dilatation. Spleen: No focal abnormality.  Normal size. Adrenals/Urinary Tract: No adrenal abnormality. No focal renal abnormality. No stones or hydronephrosis. Urinary bladder is unremarkable. Stomach/Bowel: Normal appendix. Stomach, large and small bowel grossly unremarkable. Vascular/Lymphatic: No evidence of aneurysm or adenopathy. Reproductive: Uterus and adnexa unremarkable.  No mass. Other: No free fluid or free air. Musculoskeletal: No acute bony abnormality. IMPRESSION: No renal or ureteral stones.  No hydronephrosis. No acute findings. Electronically Signed   By: Charlett Nose M.D.   On: 09/03/2023 18:10    Procedures Procedures    Medications Ordered in ED Medications  ondansetron (ZOFRAN) injection 4 mg (4 mg Intravenous Given 09/03/23 1723)  HYDROmorphone (DILAUDID) injection 1 mg (1 mg Intravenous Given 09/03/23 1729)  ketorolac (TORADOL) 30 MG/ML injection 15 mg (15 mg Intravenous Given 09/03/23 1830)  metoCLOPramide (REGLAN) injection 10 mg (10 mg Intravenous Given 09/03/23 1939)  sodium chloride 0.9 % bolus 1,000 mL (1,000 mLs Intravenous New Bag/Given 09/03/23 2000)  cefTRIAXone (ROCEPHIN) 1 g  in sodium chloride 0.9 % 100 mL IVPB (1 g Intravenous New Bag/Given 09/03/23 1959)    ED Course/ Medical Decision Making/ A&P Clinical Course as of 09/03/23 2201  Mon Sep 03, 2023  2156 Left flank pain this morning with n/v. Pelvic u/s to rule out torsion. If ok can reassess and likely dc. [JR]    Clinical Course User Index [JR] Gareth Eagle, PA-C                                 Medical Decision Making Patient presenting with left flank to the left lower abdomen abdominal pain of unclear etiology.  Her pain started very abruptly around 11 AM this morning and has been accompanied by nausea and vomiting.  She presented with severe pain, had difficulty lying still secondary to pain symptoms.  No history of kidney stones but this is at the top of  differential given her presentation.  Other possibilities include diverticulitis, small bowel obstruction, pyelonephritis, musculoskeletal source, GYN source such as ovarian cyst or rupture.  Labs revealing for many bacteria in her urine along with trace leukocytes, otherwise unremarkable.  CT imaging was also unremarkable, significant for no sign of kidney stones, ureteral stones, no perinephric stranding to suggest pyelonephritis.  She continues to have significant pain and nausea.  We will plan a pelvic ultrasound to rule out ovarian cyst versus torsion.  Pt signed out to Riki Sheer PA-C  Amount and/or Complexity of Data Reviewed Labs: ordered.    Details: Mild hypokalemia at 3.2, urinalysis sniffing and for many bacteria, trace leukocytes, cloudy appearance, Rocephin IV has been ordered.  CBC is normal specifically WBC count is 4.8 Radiology: ordered.    Details: CT renal study negative for renal or ureteral stones and no other obvious source of pain is found.  Risk Prescription drug management.           Final Clinical Impression(s) / ED Diagnoses Final diagnoses:  None    Rx / DC Orders ED Discharge Orders     None          Victoriano Lain 09/03/23 2201    Rondel Baton, MD 09/06/23 [phone]

## 2023-09-03 NOTE — ED Provider Notes (Signed)
Accepted handoff at shift change from Burgess Amor, PA-C. Please see prior provider note for more detail.   Briefly: Patient is 58 y.o.   DDX: concern for kidney stone, diverticulitis, small bowel obstruction, pyelonephritis, musculoskeletal source, GYN source such as ovarian cyst or rupture.   Plan: Pelvic ultrasound to rule out torsion.  If negative, likely discharge.   Physical Exam  BP 118/69   Pulse 77   Temp 97.6 F (36.4 C) (Oral)   Resp 16   Ht 5\' 4"  (1.626 m)   Wt 79.4 kg   SpO2 90%   BMI 30.04 kg/m   Physical Exam  Procedures  Procedures  ED Course / MDM   Clinical Course as of 09/03/23 2353  Mon Sep 03, 2023  2156 Left flank pain this morning with n/v. Pelvic u/s to rule out torsion. If ok can reassess and likely dc. [JR]    Clinical Course User Index [JR] Gareth Eagle, PA-C   Medical Decision Making Amount and/or Complexity of Data Reviewed Labs: ordered. Radiology: ordered.  Risk Prescription drug management.   Patient undergoing pelvic ultrasound at this time.  Signed out to Dr. Jaci Carrel for further management.       Gareth Eagle, PA-C 09/04/23 0003    Gilda Crease, MD 09/04/23 [phone]

## 2023-09-03 NOTE — ED Notes (Signed)
Security reviewed video footage from Pt in the waiting room lobby where at 1650, Pt observed bent down to a knee by a trash can, and then hurled herself flat on the floor and began yelling in pain. Pt was observed hollering in pain by this RN after leaving the Triage area and helped assist the Pt into a wheelchair. No obvious signs of injury were noted.

## 2023-09-03 NOTE — ED Triage Notes (Addendum)
Pt reports left lower back to abdomen pain starting around lunch. Pt took 2 tylenol and placed a lidocaine patch with no relief. Pt denies injury or urinary symptoms. Pt went to urgent care. Pt reports UC would not see her and sent her here. 10/10 pain  Pt is vomiting.

## 2023-09-04 MED ORDER — ONDANSETRON 4 MG PO TBDP: ORAL_TABLET | ORAL | 0 refills | Status: AC

## 2023-09-04 MED ORDER — CEPHALEXIN 500 MG PO CAPS: 500.0000 mg | ORAL_CAPSULE | Freq: Three times a day (TID) | ORAL | 0 refills | Status: AC

## 2023-09-04 MED ORDER — TRAMADOL HCL 50 MG PO TABS: 50.0000 mg | ORAL_TABLET | Freq: Four times a day (QID) | ORAL | 0 refills | Status: AC | PRN

## 2023-09-04 NOTE — ED Provider Notes (Signed)
Patient signed out to me awaiting pelvic ultrasound.  Ultrasound shows evidence of ovarian cyst without torsion, no other pathology.  Patient resting comfortably.  Discharged with analgesia, antiemetics and empiric coverage for urinary pathogens.   Gilda Crease, MD 09/04/23 [phone]
